# Patient Record
Sex: Female | Born: 1950 | Race: White | Hispanic: No | Marital: Married
Health system: Midwestern US, Community
[De-identification: ages and names within clinical notes are randomized; demographics above are authoritative.]

---

## 2012-01-19 IMAGING — MG MAMMOGRAPHY DIAGNOSTIC LEFT 3D TOMOSYNTHESIS WITH CAD
1 series · 3 of 3 positions shown · non-contrast
Comparison: Fortunately we were provided with a copy of the left breast
mammograms from 04/24/11.

MAMMOGRAPHY DIAGNOSTIC LEFT WITH CAD, 01/19/12:
HISTORY: [place]  She had a
screening mammogram [place] on 04/24/11.  On that report it was
recommended that she have ultrasound of the inferior left breast because of
a focal mammographic abnormality at 6 o'clock.
BREAST DENSITY: (Level 3 of 4) The breast tissue is heterogeneously dense.
This may lower the sensitivity of mammography.

[L CC · left · 3 of 3 slices shown]
[im 1/3]
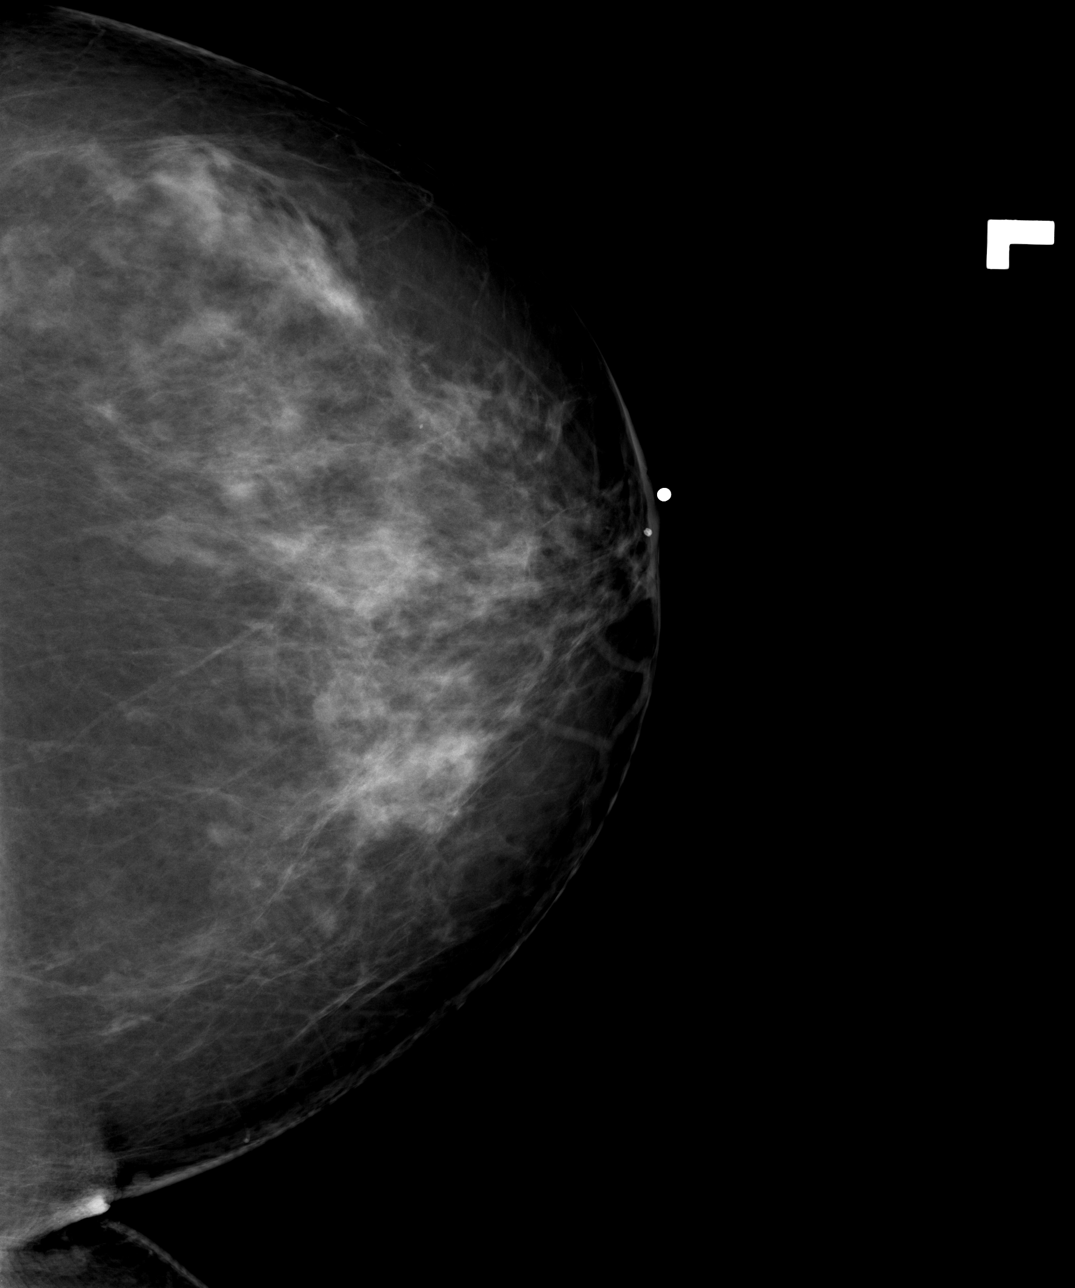
[im 2/3]
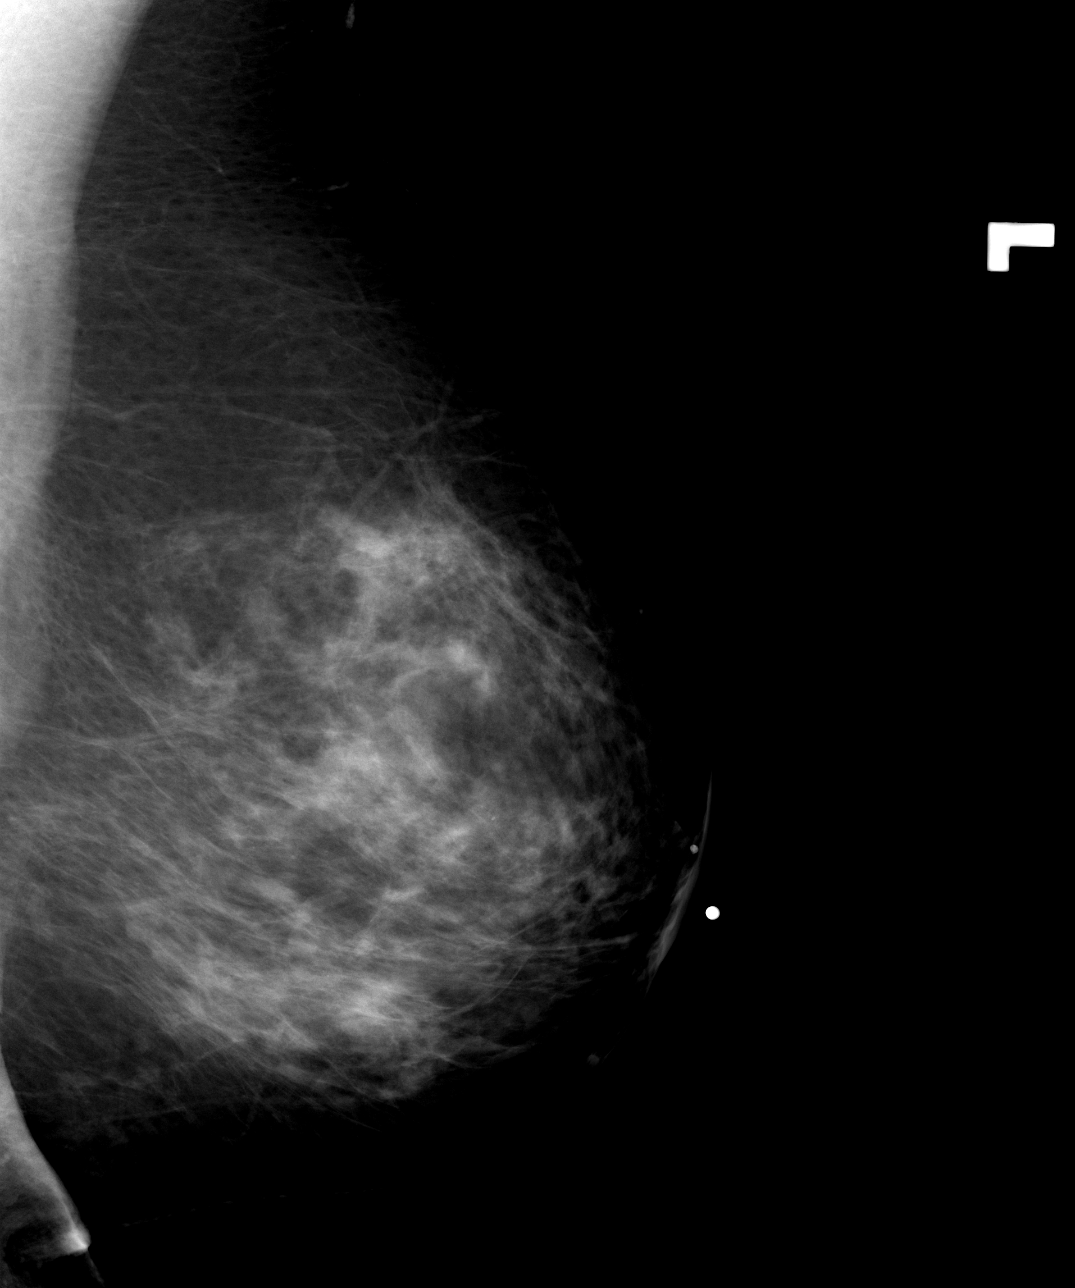
[im 3/3]
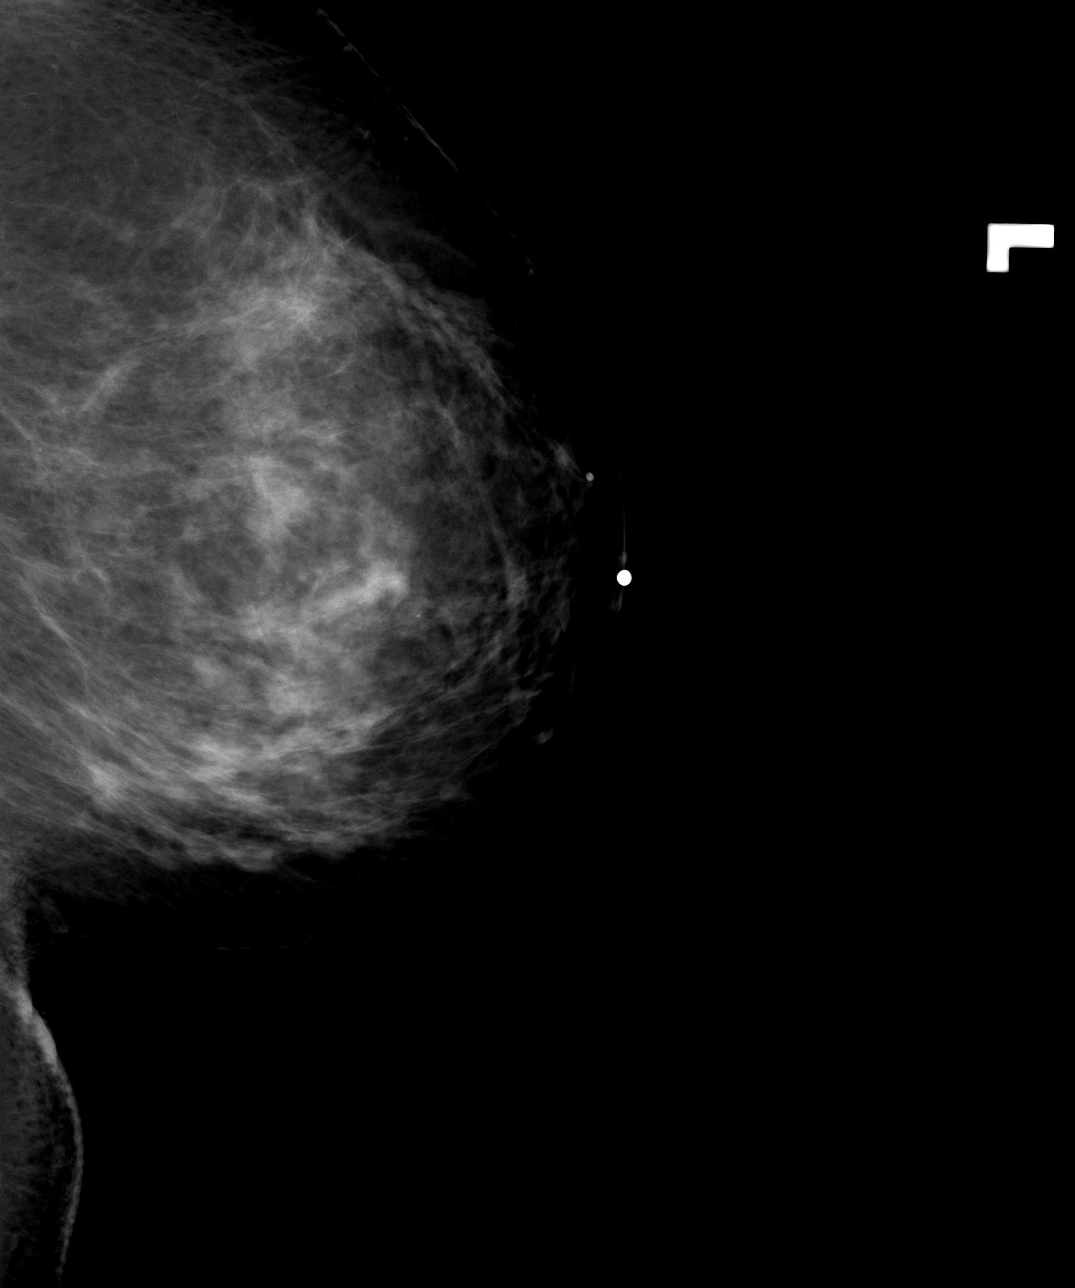

[3 of 3 positions shown; findings below may reference images not displayed]

FINDINGS: CC, MLO, and lateral mammograms are performed.  These show
numerous ill-defined nodular opacities which are not significantly changed
from the outside mammograms.
Sonography of the inferior left breast showed a potentially suspicious area
of ill-defined shadowing at 6 o'clock but this does not correlate with
anything on today's mammograms.
In summary, we have a potentially suspicious abnormality at 6 o'clock in
the left breast found by sonography only.  Under these circumstances I am
recommending that MR imaging of the breast be performed for further
evaluation.
IMPRESSION: (BI-RADS 0) Further evaluation with MRI is suggested.

## 2017-05-16 IMAGING — MG MAMMOGRAPHY SCREENING BILATERAL 3D TOMOSYNTHESIS WITH CAD
6 of 14 series · 6 of 40 positions shown · non-contrast
Comparison: 05/08/2016 through 04/23/2014. 
BREAST DENSITY: (Level B) There are scattered areas of fibroglandular density.

MAMMOGRAPHY SCREENING BILATERAL 3D TOMOSYNTHESIS WITH CAD, 05/16/2017 [DATE]: 
CLINICAL INDICATION: Screening.
TECHNIQUE: Digital bilateral mammograms and 3-D Tomosynthesis were obtained. 
These were interpreted both primarily and with the aid of computer-aided 
detection system.

[R CC synth-2D]
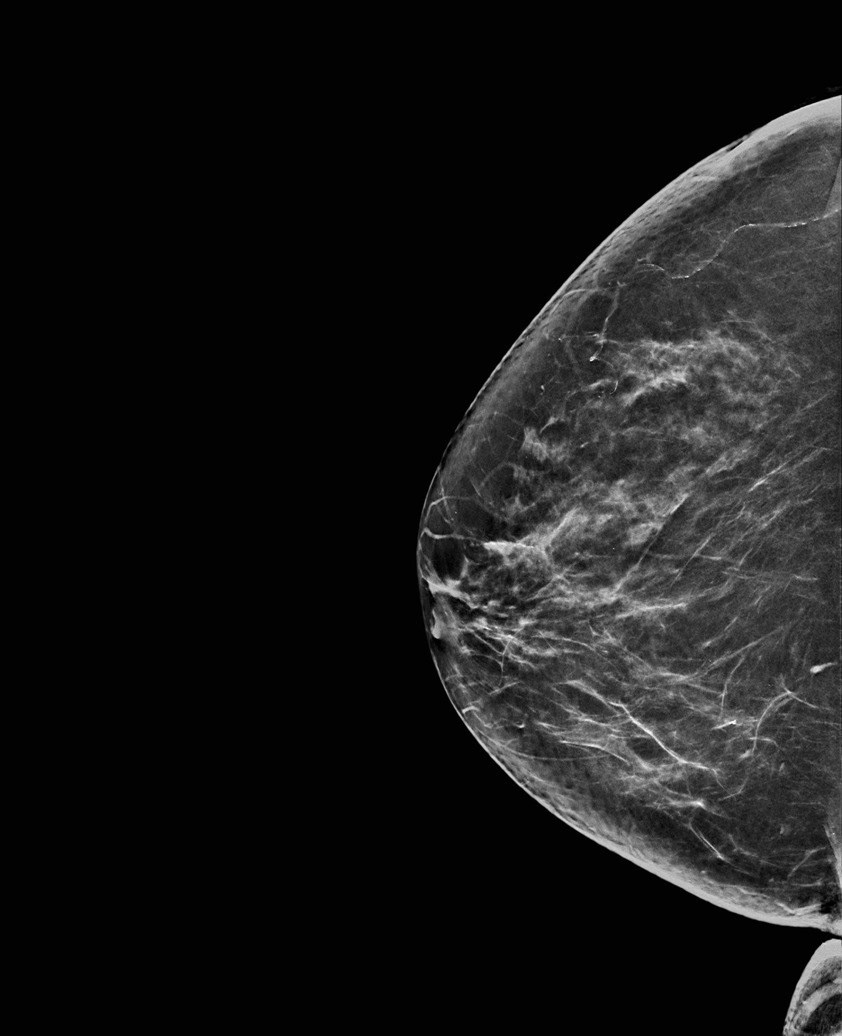

[L CC synth-2D]
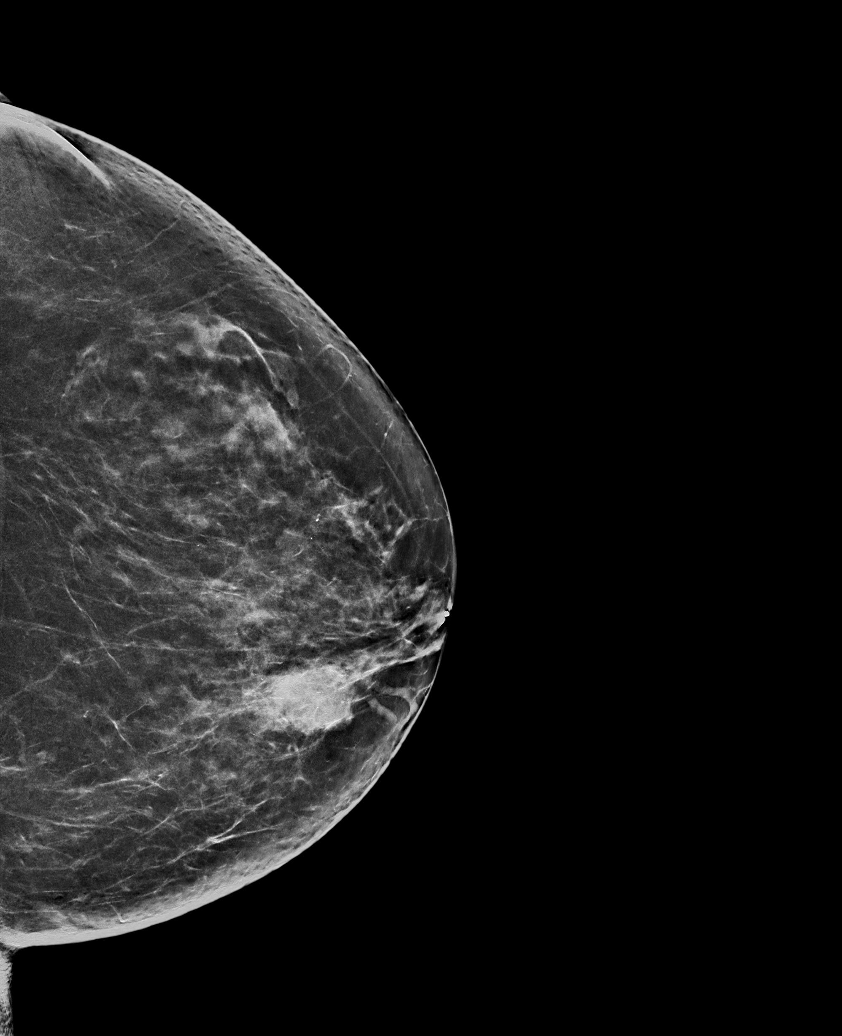

[L MLO synth-2D]
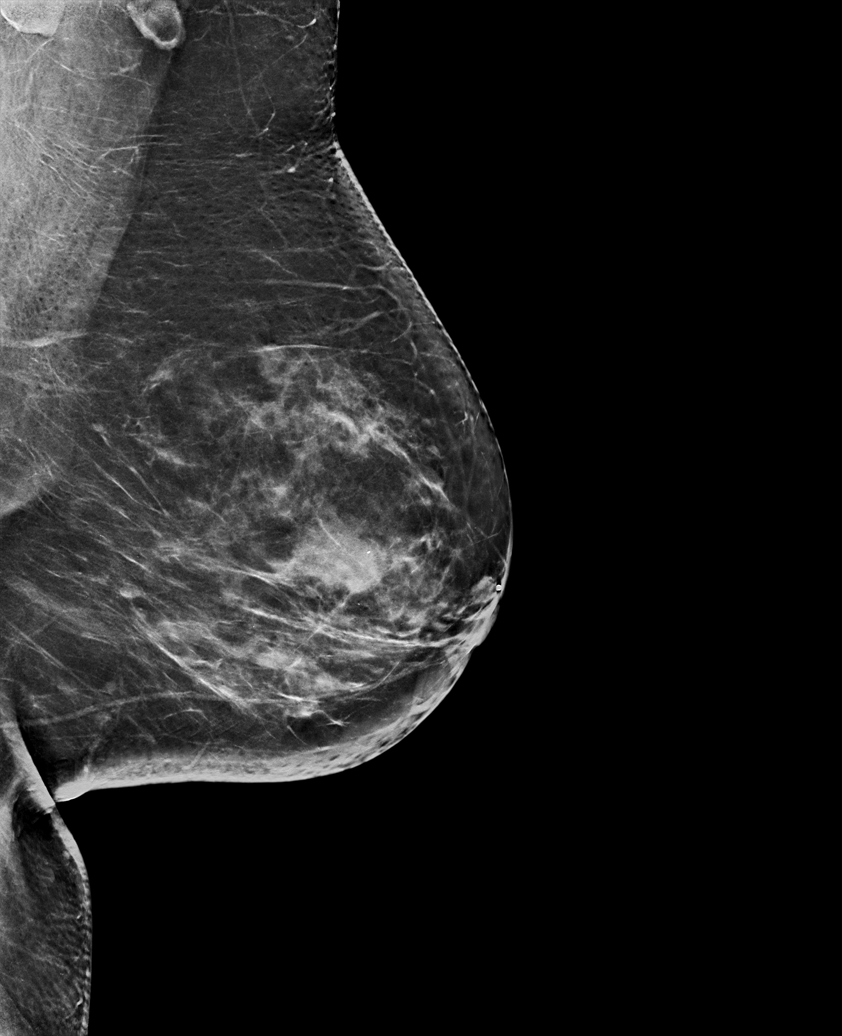

[R MLO synth-2D]
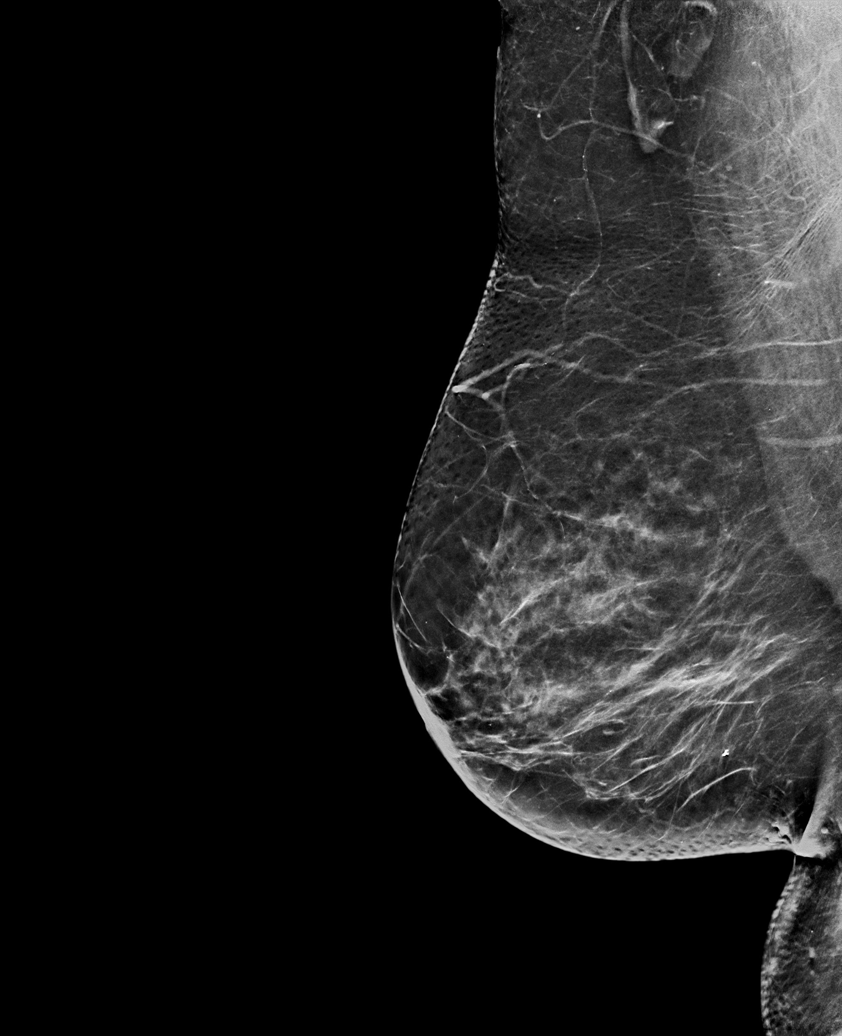

[L CC]
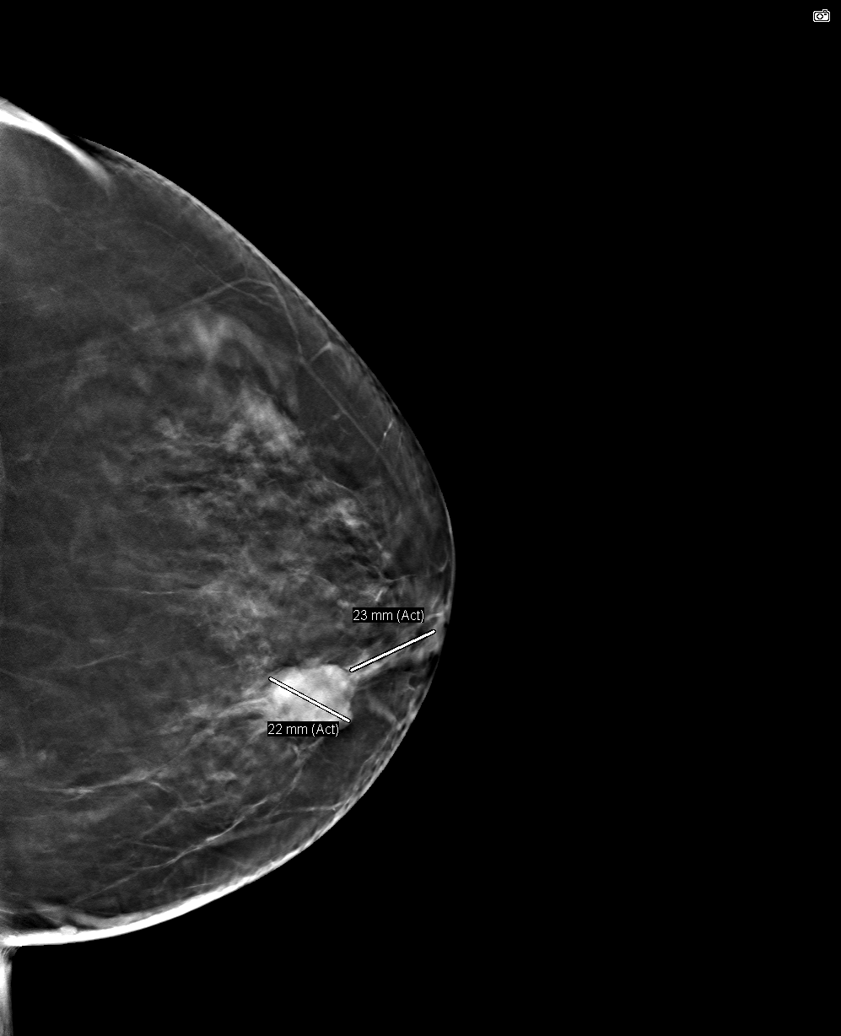

[L MLO]
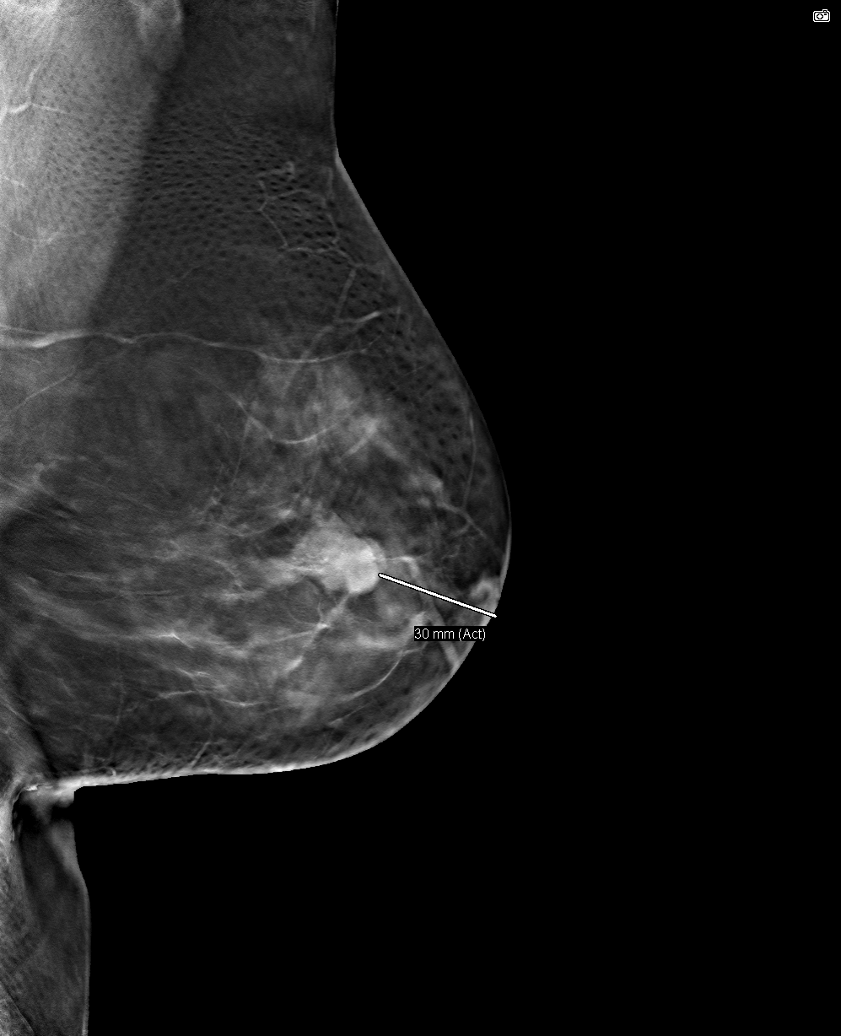

[6 of 40 positions shown; findings below may reference images not displayed]

FINDINGS: A 22 mm lobulated focal density has developed in the left breast 3 
cm 
deep to the nipple in the medial aspect of the breast. This lesion will be 
further evaluated with sonography. The breasts are otherwise unremarkable and 
unchanged.
IMPRESSION: (BI-RADS 0) Incomplete. Further evaluation will be performed as discussed 
above, 
and the results will be reported separately.

## 2017-07-17 IMAGING — US ULTRASOUND GUIDED BREAST BX
1 series · 6 of 6 positions shown · non-contrast
Comparison: none

ULTRASOUND GUIDED BREAST BX, 07/17/2017 [DATE]: 
CLINICAL INDICATION: Irregularly marginated mass in the left breast at [DATE], 
4 
cm from the nipple.
TECHNIQUE: Following discussion of the procedure with the patient including 
the 
possibilities of bleeding, infection, local tissue damage, and failure to 
acquire a diagnostic biopsy and obtaining informed consent, the patient's left 
breast was prepped and draped in the usual sterile manner. Local anesthesia 
was 
administered along the superior medial margin of the left areola at [DATE]. 
Axis 
to the area of abnormality was acquired with an 18-gauge BioPince needle. 6 
core 
biopsies were obtained and were sent for pathologic assessment. The patient 
tolerated the procedure well. No complications were experienced.

[Series 2: vascular · 6 of 6 slices shown]
[im 1/6]
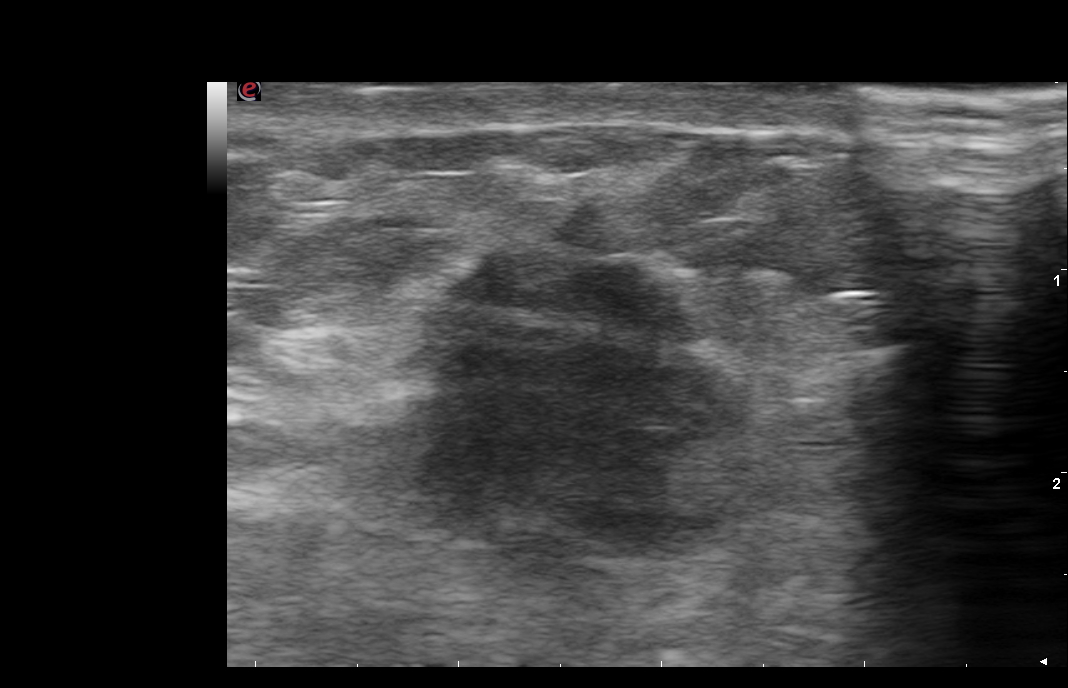
[im 2/6]
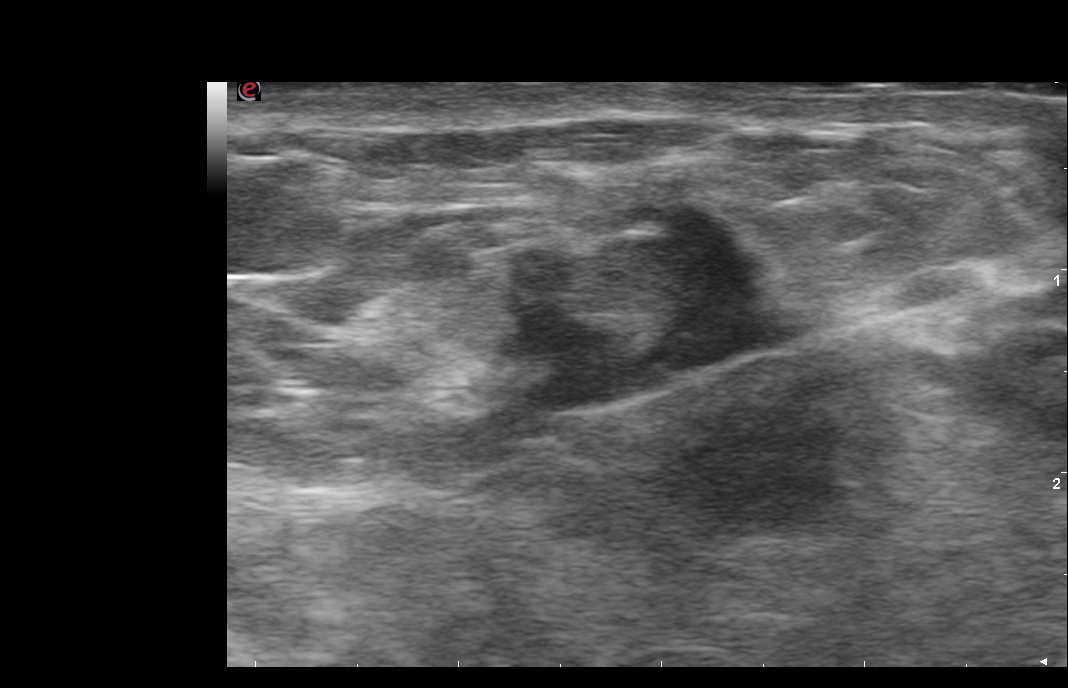
[im 3/6]
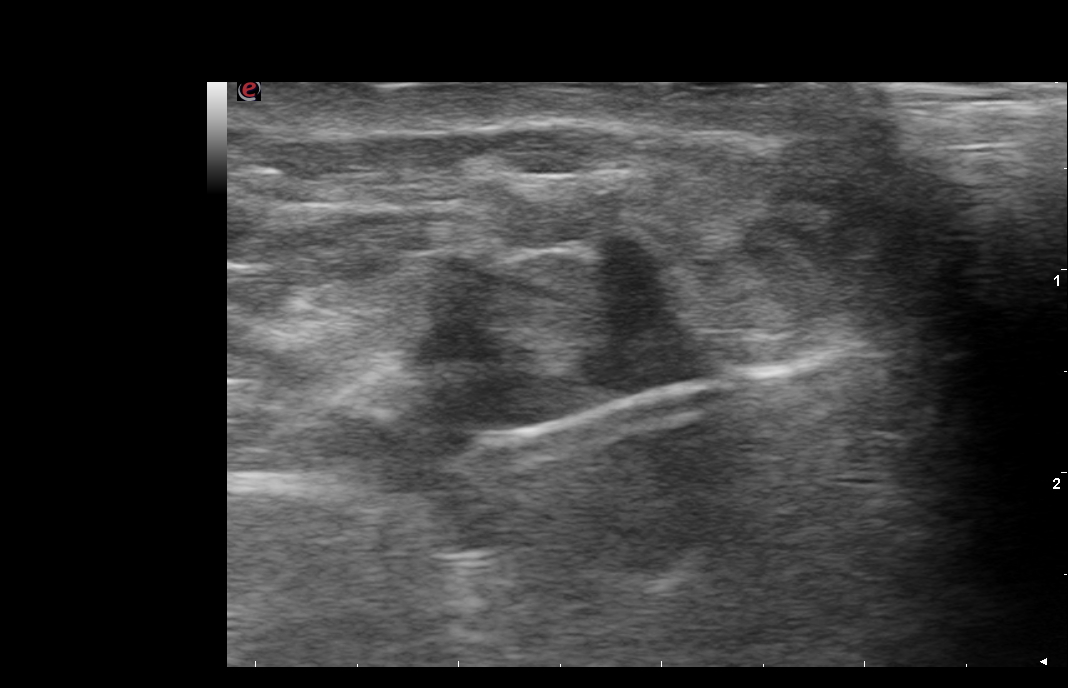
[im 4/6]
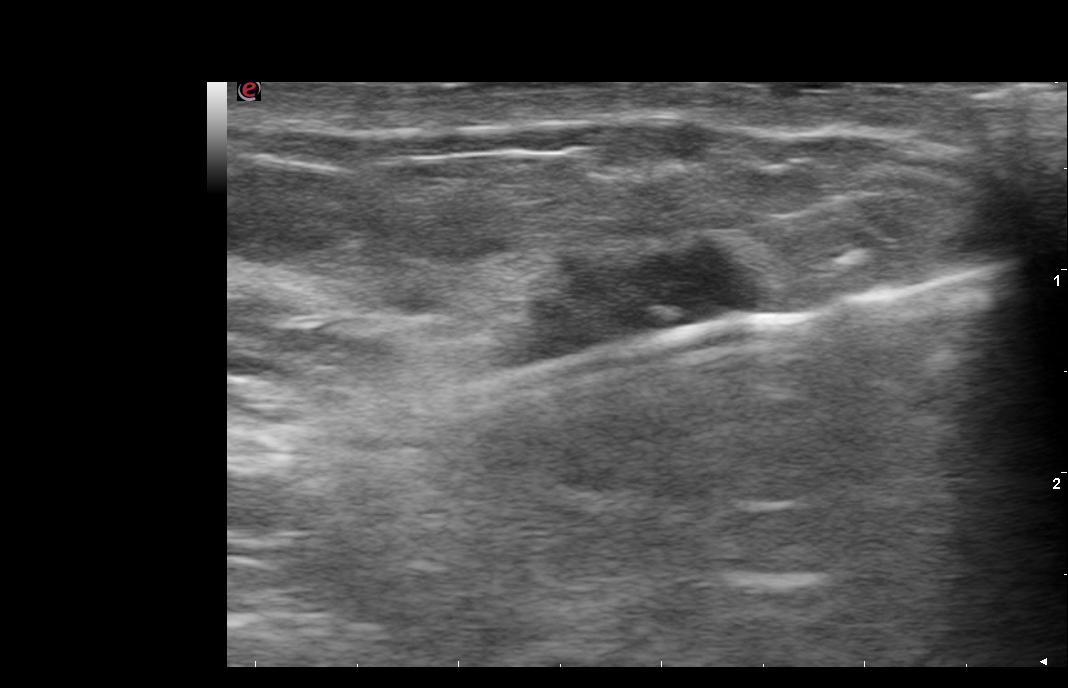
[im 5/6]
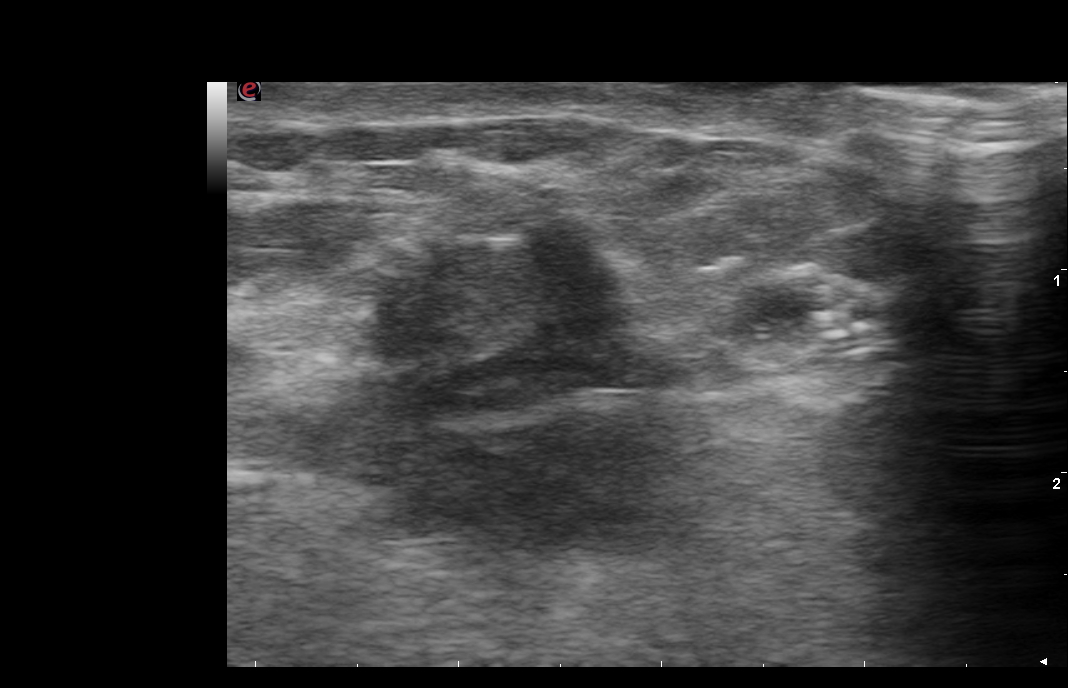
[im 6/6]
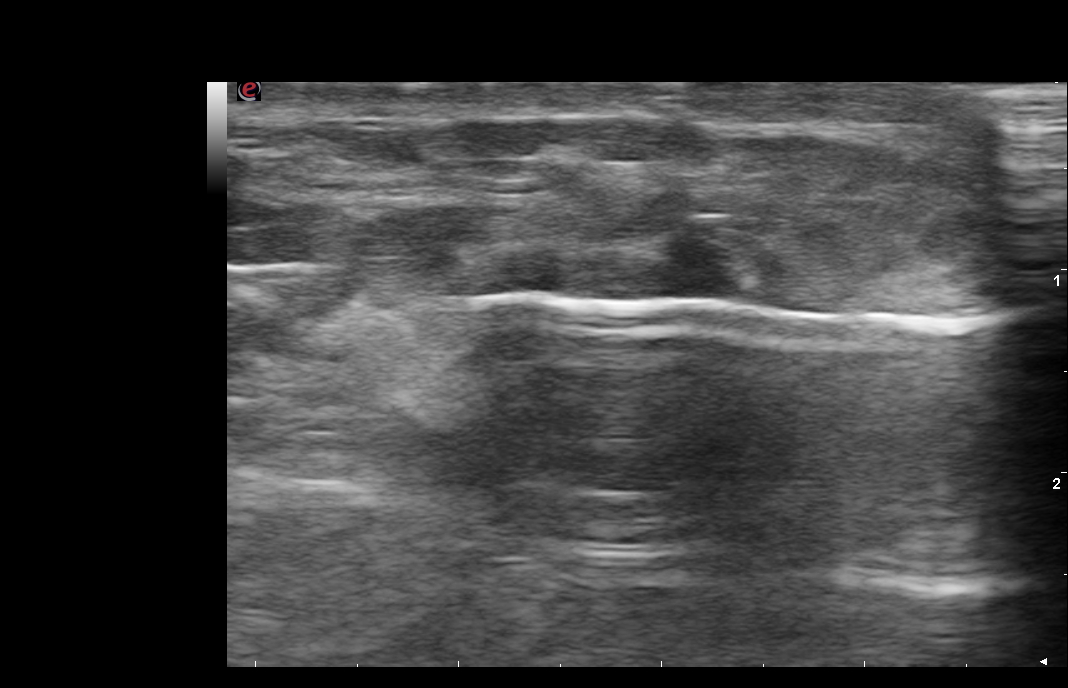

[6 of 6 positions shown; findings below may reference images not displayed]

IMPRESSION: Left breast mass biopsy performed under ultrasound guidance 
without 
complications as described above.

## 2019-09-26 IMAGING — MR MRI BRAIN W/WO CONTRAST
11 series · 43 of 48 positions shown · IV contrast (gadavist)
Comparison: Enhanced cranial MRI June 09, 2019, cranial CT June 12, 2019

MRI BRAIN W/WO CONTRAST,09/26/2019 [DATE]: 
CLINICAL INDICATION: Status post meningioma resection June 11, 2019
TECHNIQUE: Axial T1, Axial T2, Axial FLAIR, Diffusion weighted images, Sagittal 
T1, Enhanced Axial T1, and Enhanced coronal fat-suppressed T1 were obtained. 8 
cc's of gadavist was injected intravenously by hand. The patient's eGFR was 
calculated to be 82.7 using the i-STAT device. The patient's eGFR was calculated 
to be 82.7 using the i-STAT device.

[Series 103: patient aligned mpr · axial · 15.6mm · 0.98mm/px · z∈[-73,+153]mm · 6 of 51 slices shown]
[im 1/51]
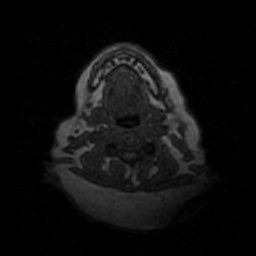
[im 11/51]
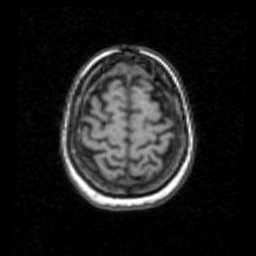
[im 21/51]
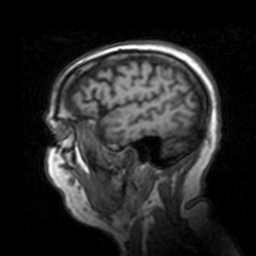
[im 31/51]
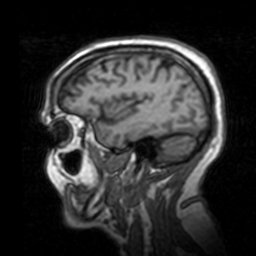
[im 41/51]
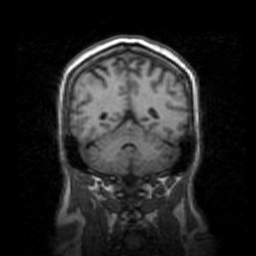
[im 51/51]
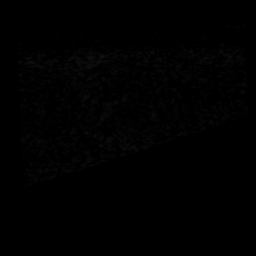

[Series 201: T1 · sagittal · 4.0mm · 0.86mm/px · 2 of 25 slices shown (1 of 2)]
[im 1/25]
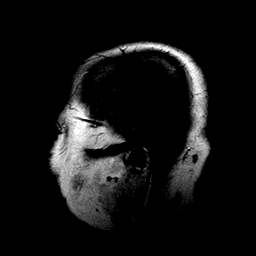
[im 25/25]
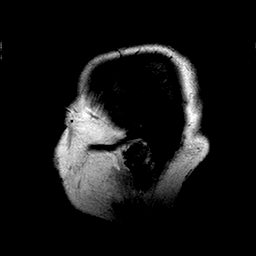

[Series 301: T1 · axial · 5.0mm · 0.60mm/px · z∈[-47,+108]mm · 4 of 27 slices shown (2 of 2)]
[im 1/27]
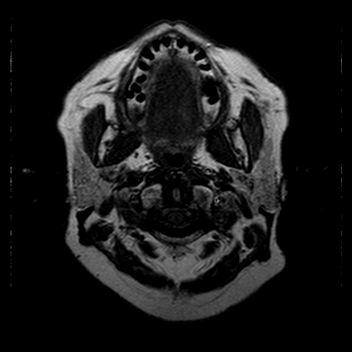
[im 9/27]
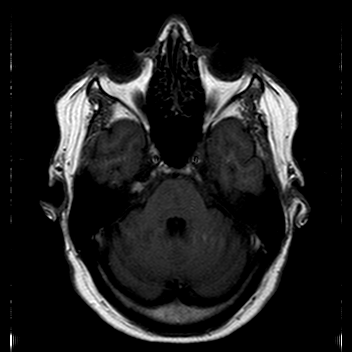
[im 18/27]
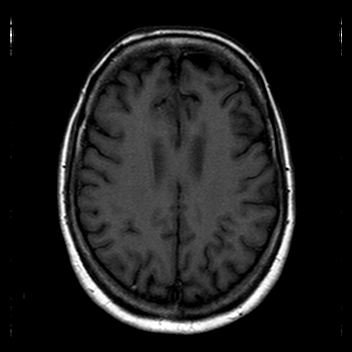
[im 27/27]
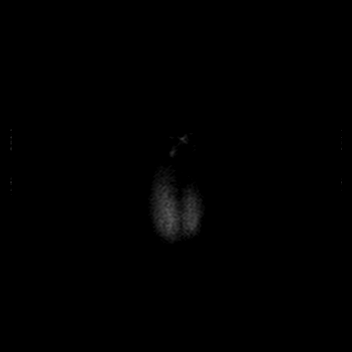

[Series 401: T2 · axial · 5.0mm · 0.41mm/px · z∈[-47,+108]mm · 4 of 27 slices shown]
[im 1/27]
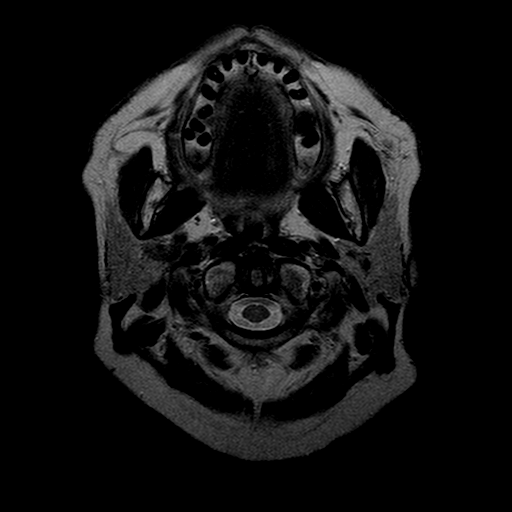
[im 9/27]
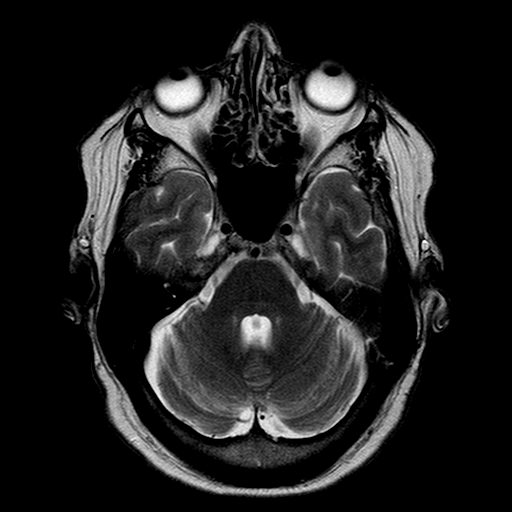
[im 18/27]
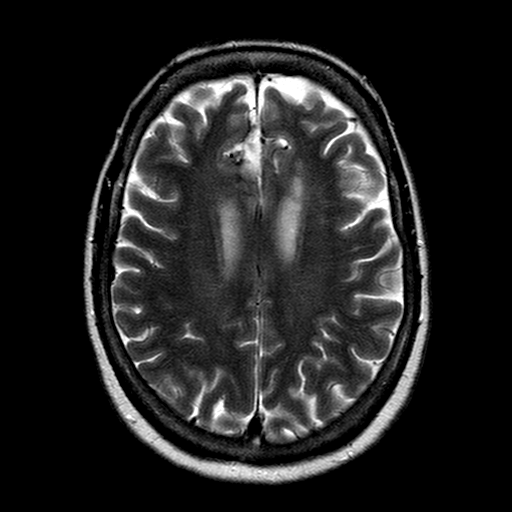
[im 27/27]
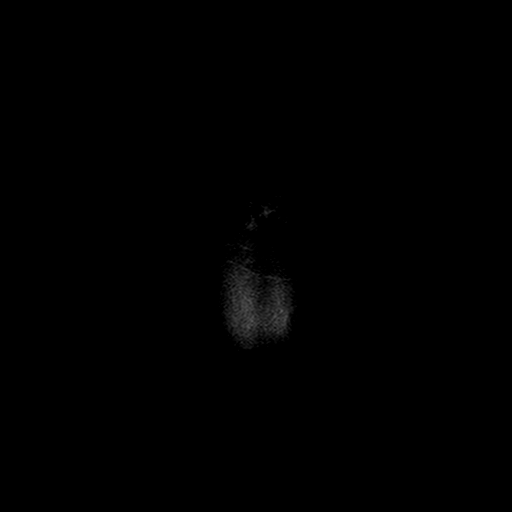

[Series 501: FLAIR · axial · 5.0mm · 0.73mm/px · z∈[-47,+108]mm · 4 of 27 slices shown]
[im 1/27]
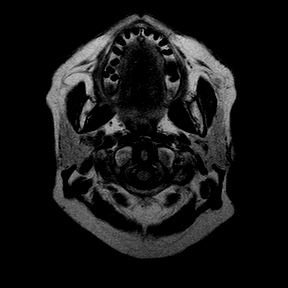
[im 9/27]
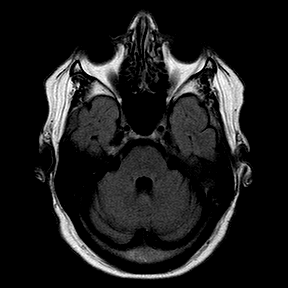
[im 18/27]
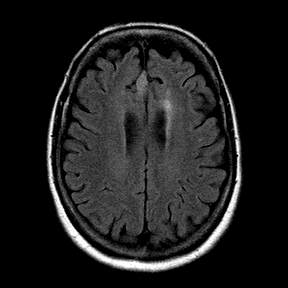
[im 27/27]
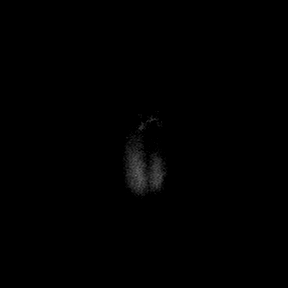

[Series 603: (id) · axial · 5.0mm · 1.80mm/px · z∈[-47,+109]mm · 4 of 27 slices shown]
[im 1/27]
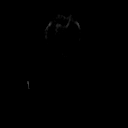
[im 9/27]
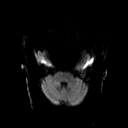
[im 18/27]
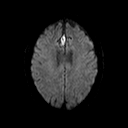
[im 27/27]
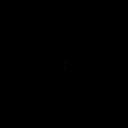

[Series 604: dadc map · axial · 5.0mm · 1.80mm/px · z∈[-47,+109]mm · 4 of 27 slices shown]
[im 1/27]
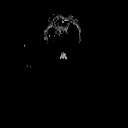
[im 9/27]
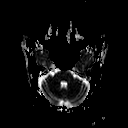
[im 18/27]
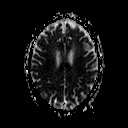
[im 27/27]
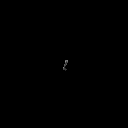

[Series 701: ven_bold · axial · 5.0mm · 0.41mm/px · z∈[-43,-3]mm · 3 of 60 slices shown]
[im 1/60]
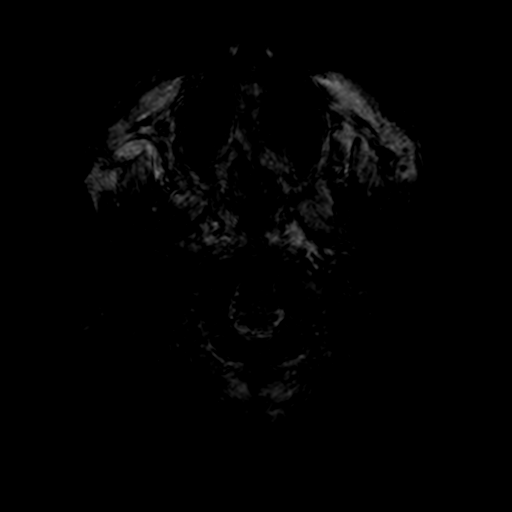
[im 9/60]
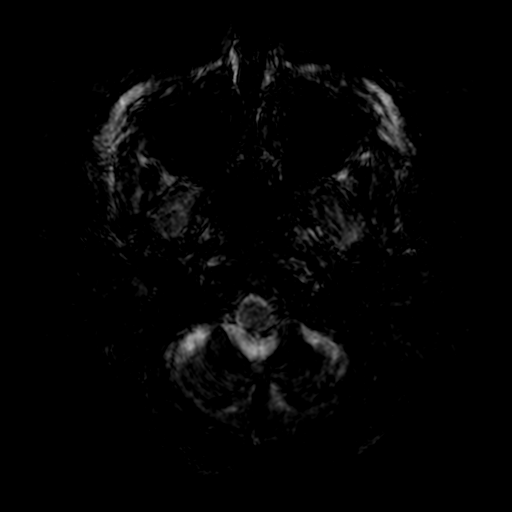
[im 17/60]
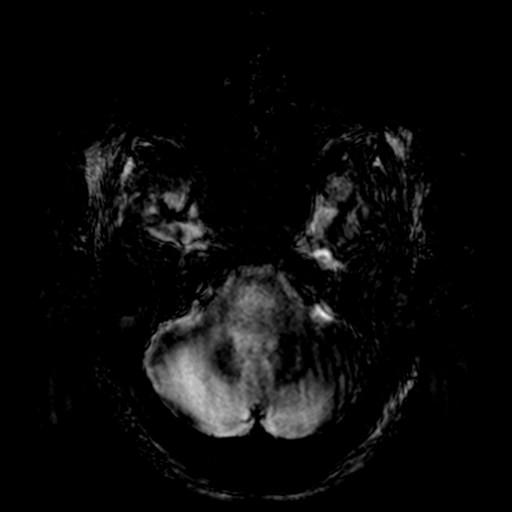

[Series 801: T1 post-contrast · axial · 5.0mm · 0.60mm/px · z∈[-47,+108]mm · 4 of 27 slices shown (1 of 3)]
[im 1/27]
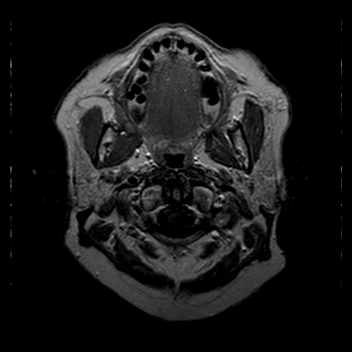
[im 9/27]
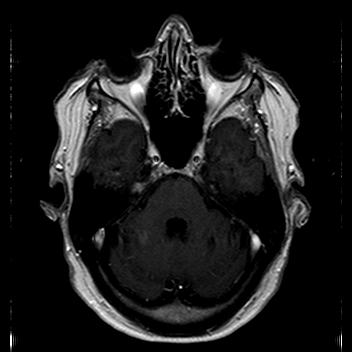
[im 18/27]
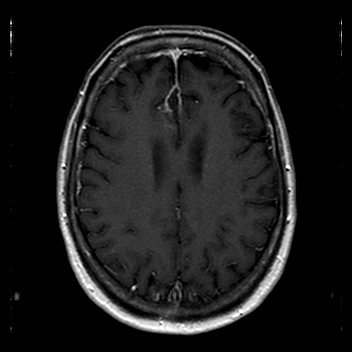
[im 27/27]
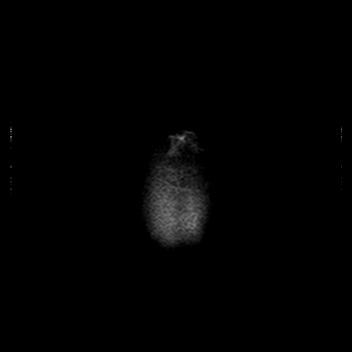

[Series 901: T1 post-contrast · coronal · 4.0mm · 0.88mm/px · 5 of 36 slices shown (2 of 3)]
[im 1/36]
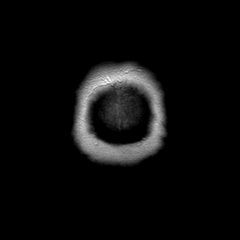
[im 9/36]
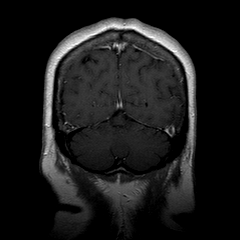
[im 18/36]
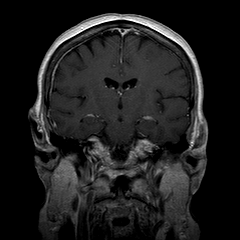
[im 27/36]
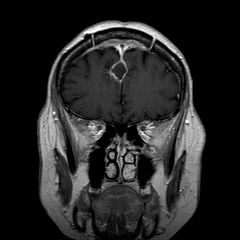
[im 36/36]
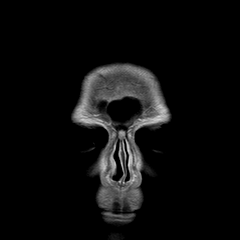

[Series 1001: T1 post-contrast · sagittal · 4.0mm · 0.86mm/px · 3 of 25 slices shown (3 of 3)]
[im 1/25]
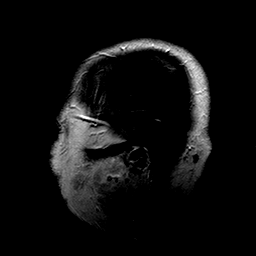
[im 13/25]
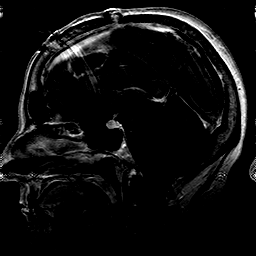
[im 25/25]
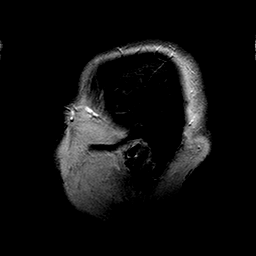

[43 of 48 positions shown; findings below may reference images not displayed]

FINDINGS: The large anterior parafalcine meningioma has been resected. There is 
a small fluid collection at the resection cavity extending 2.6 cm AP dimension, 
1.5 cm coronal plane. There is mild increased T2 signal in the adjacent frontal 
lobes, left greater than right, compatible with gliosis. There is mild 
peripheral enhancement. There is no diffusion restriction to indicate recent 
infarct. No other pathologic enhancement or mass effect identified. 
FLAIR sequences show mild increased signal along the ventricular margins. There 
is no hydrocephalus. No discrete brainstem or cerebellar findings. Major 
intracranial arterial segments are open. Paranasal sinuses and otomastoid spaces 
are clear. There is no orbital mass. Craniocervical junction is open. Sellar 
contents show vertical height of the adenohypophysis as great as 7 mm on 
enhanced sagittal image 13, not different from the prior study. Major dural 
sinuses are open. Paranasal sinuses and otomastoid spaces appear clear.
IMPRESSION: Status post resection of the large anterior parafalcine meningioma. There is a 
small resection cavity, without evidence for residual nodular enhancement or 
mass effect. There is mild adjacent gliosis, left greater than right. No new 
intracranial mass. 
No evidence for recent infarct. 
Mild prominence of the adenohypophysis for age, similar to the prior MRI. 
Endocrinologic correlation advised. This could represent pituitary hyperplasia, 
but large macroadenoma cannot be excluded. If clinically indicated dedicated 
dynamic enhanced pituitary MRI would be useful.

## 2020-05-21 IMAGING — CT CT ABDOMEN AND PELVIS W/WO CONTRAST
2 of 4 series · 14 of 46 positions shown, 16 images · IV contrast (APPLIED)
Comparison: There are no previous exams available for comparison.

CT ABDOMEN AND PELVIS W/WO CONTRAST, 05/21/2020 [DATE]: 
CLINICAL INDICATION:  Epigastric pain, diarrhea 
A search for DICOM formatted images was conducted for prior CT imaging studies 
completed at a non-affiliated media free facility.
TECHNIQUE: The abdomen and pelvis were scanned from lung bases through the 
pubic rami without and with 122cc's of Isovue 300 contrast injected 
intravenously on a high-resolution CT scanner using dose reduction techniques. 
Routine MPR reconstructions were performed.

[Series 5: abd/pel ax w · axial · 0.84mm/px · z∈[-458,-30]mm · 11 of 161 slices shown, 13 images]
[im 9/161  soft-tissue]
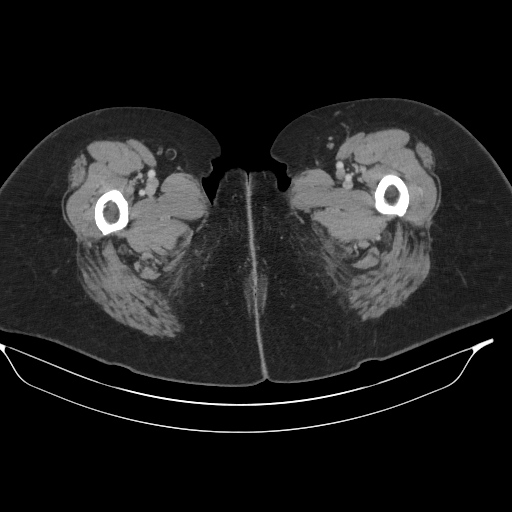
[im 9/161  bone]
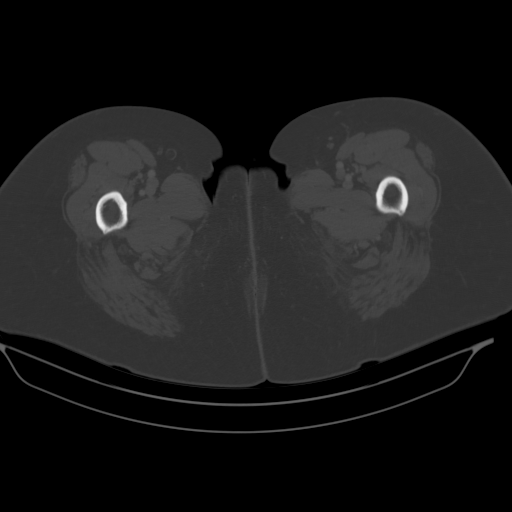
[im 26/161  soft-tissue]
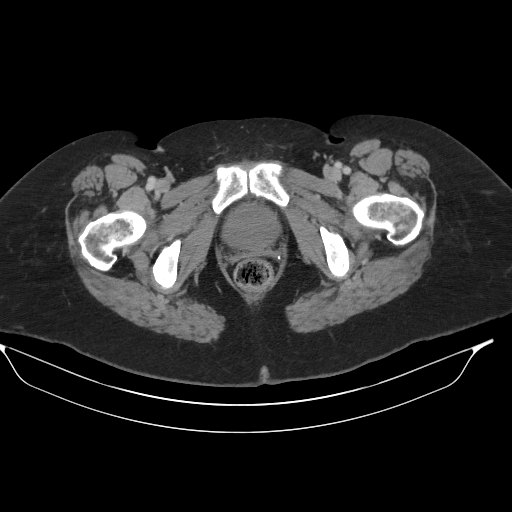
[im 43/161  soft-tissue]
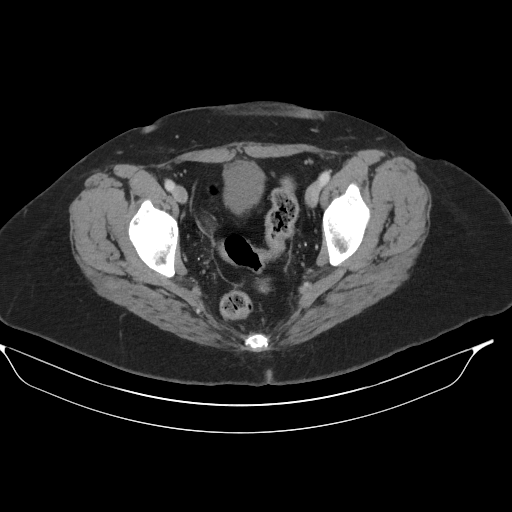
[im 51/161  soft-tissue]
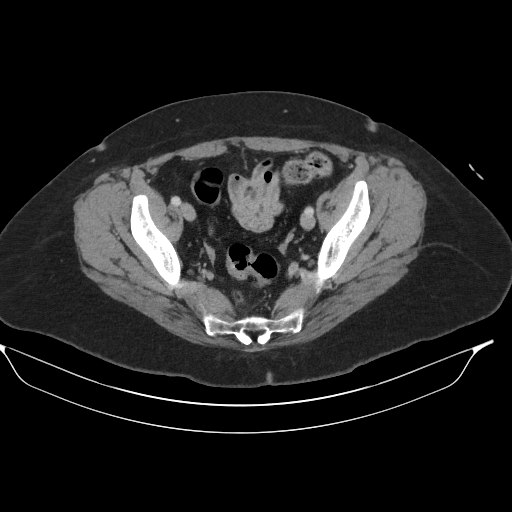
[im 68/161  soft-tissue]
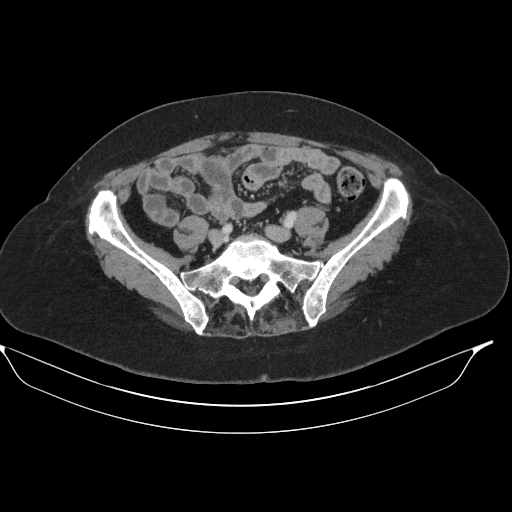
[im 85/161  soft-tissue]
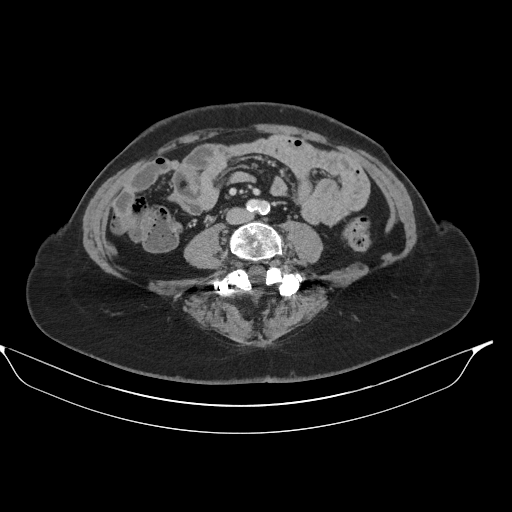
[im 93/161  soft-tissue]
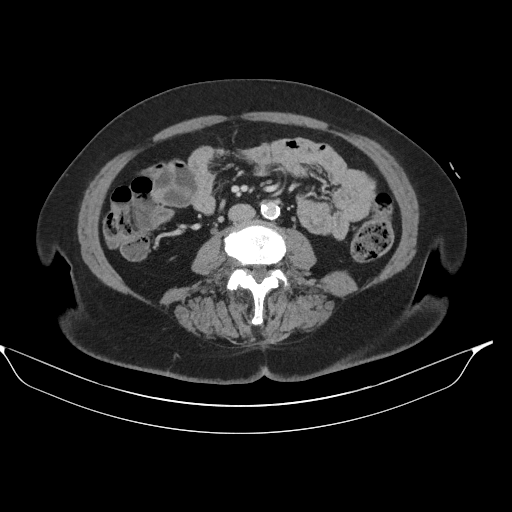
[im 110/161  soft-tissue]
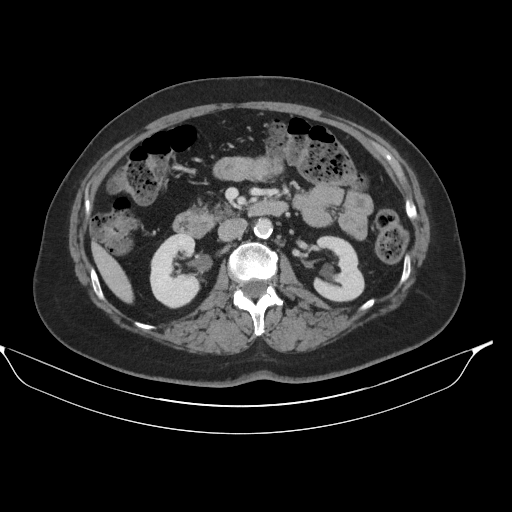
[im 118/161  soft-tissue]
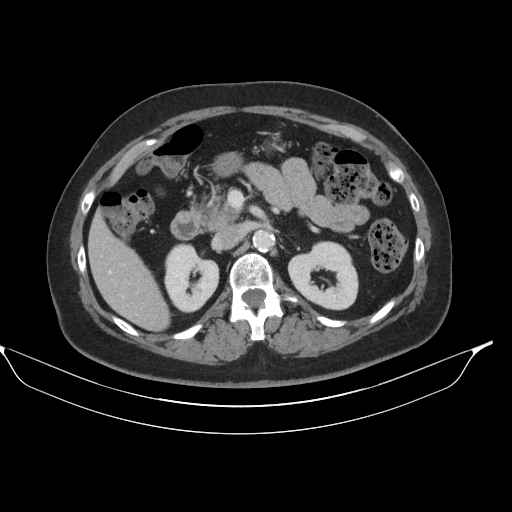
[im 118/161  bone]
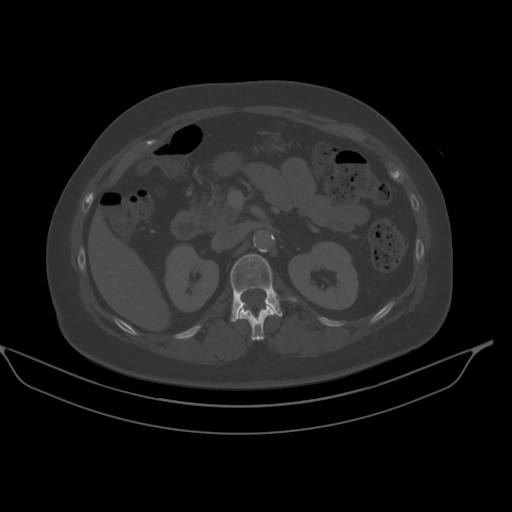
[im 135/161  soft-tissue]
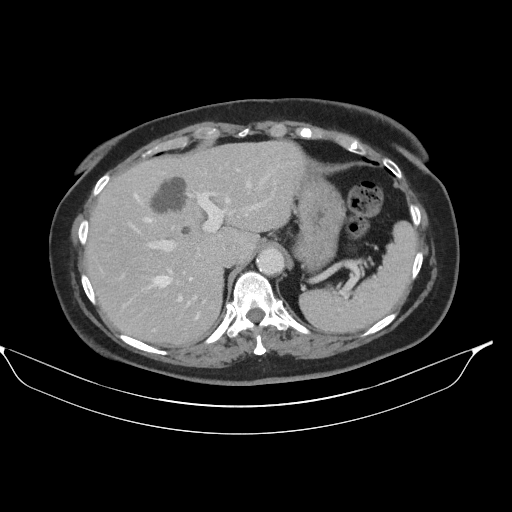
[im 152/161  soft-tissue]
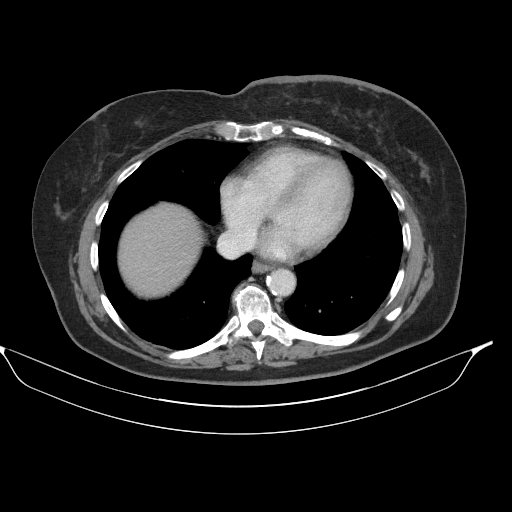

[Series 6: abd/pel cor w · coronal · 0.75mm/px · 3 of 161 slices shown]
[im 54/161  soft-tissue]
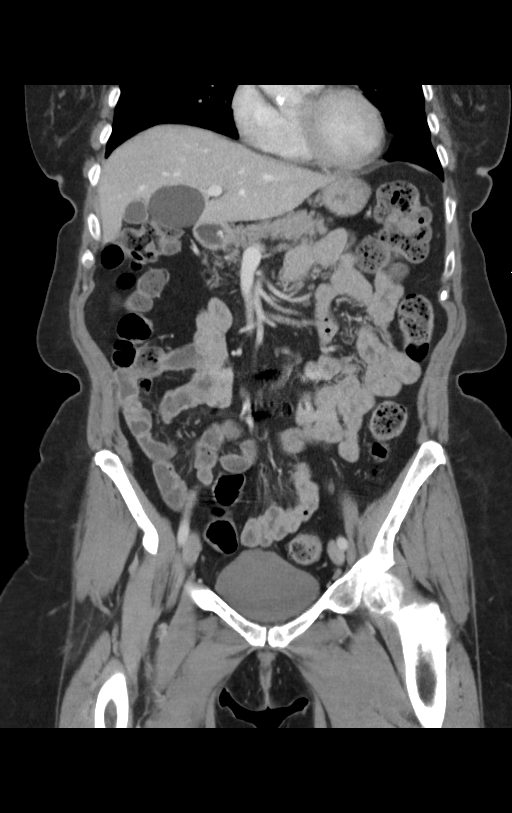
[im 72/161  soft-tissue]
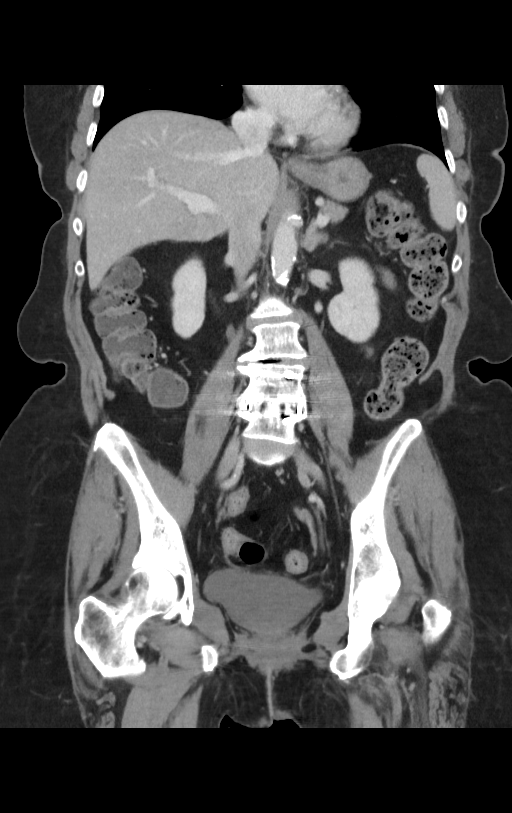
[im 89/161  soft-tissue]
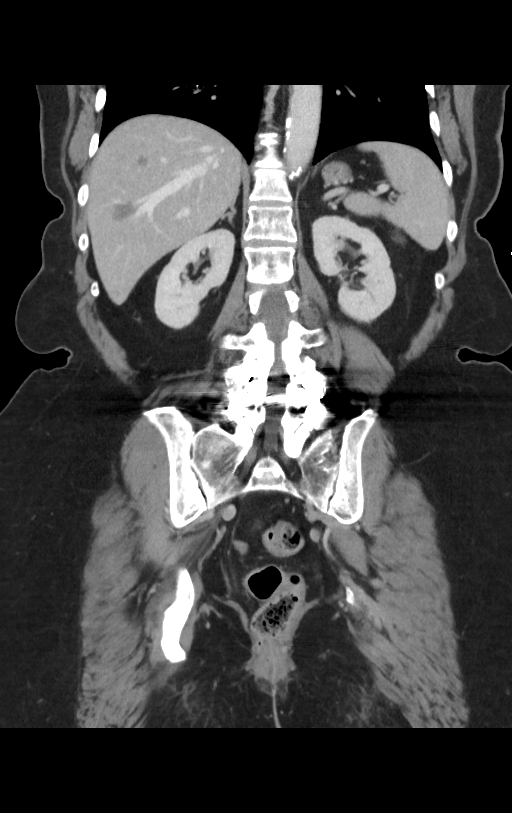

[14 of 46 positions shown; findings below may reference images not displayed]

FINDINGS: Lung bases are clear. The heart is not enlarged. There is a 
benign-appearing cyst in the right lobe of the liver, 2.3 cm. This is 
indeterminant and follow-up with enhanced CT or MRI recommended. There is a 
benign 4 cm cyst in the medial segment left hepatic lobe, smaller cyst in the 
lateral segment of the left lobe. Spleen is not enlarged. Gallbladder is 
nondistended. No gallstone identified. The common bile duct appears normal. No 
adrenal nodule. Pancreas is unremarkable. Kidneys appear normal without stone or 
hydronephrosis. Moderate aortic plaque with moderate stenosis seen on axial 
image 64, approximately 50%. 
Urinary bladder is nondistended. There is no retroperitoneal or intra-abdominal 
adenopathy. No ascites. No abdominal wall or inguinal hernia. 
No active bowel inflammation. No evidence for bowel obstruction. There is 
moderate colonic stool content. Stomach and small bowel loops appear 
unremarkable. 
There are degenerative changes. There has been lumbar fusion.
IMPRESSION: No evidence for visceral mass, adenopathy, ascites or active bowel inflammation. 
Moderate generalized colonic stool content. 
Benign-appearing hepatic cysts. 
No biliary pathology identified. A small amount of air in the distal esophagus 
is potentially associated with GERD. No hiatal hernia or gastric distention 
identified. 
Moderate aortic calcification plaque with approximately 50% stenosis. No 
aneurysm. No significant mesenteric artery occlusive disease identified. 
RADIATION DOSE REDUCTION: All CT scans are performed using radiation dose 
reduction techniques, when applicable.  Technical factors are evaluated and 
adjusted to ensure appropriate moderation of exposure.  Automated dose 
management technology is applied to adjust the radiation doses to minimize 
exposure while achieving diagnostic quality images.

## 2020-10-18 IMAGING — MR MRI BRAIN W/WO CONTRAST
10 series · 43 of 48 positions shown · IV contrast (gadavist)
Comparison: Brain MRI September 26, 2019

MRI BRAIN W/WO CONTRAST,10/18/2020 [DATE]: 
CLINICAL INDICATION: Follow-up meningioma, resection June 12, 2019
TECHNIQUE: Axial T1, Axial T2, Axial FLAIR, Diffusion weighted images, Sagittal 
T1, Enhanced Axial T1, and Enhanced coronal fat-suppressed T1 were obtained. 7 
cc's of Gadavist was injected intravenously by hand. The patient's eGFR was 
calculated to be 98 using the i-STAT device.

[Series 102: smartbrain · axial · 1.0mm · 0.98mm/px · 1 of 2 slices shown]
[im 1/2]
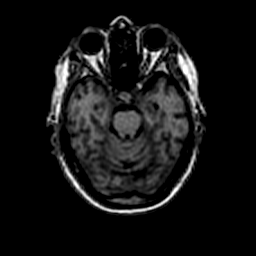

[Series 103: patient aligned mpr · axial · 15.6mm · 0.98mm/px · z∈[-77,+147]mm · 8 of 50 slices shown]
[im 1/50]
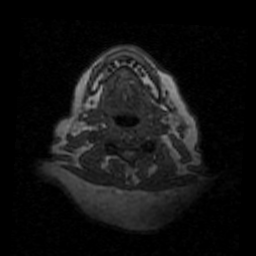
[im 8/50]
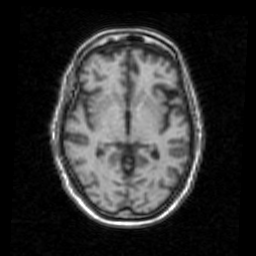
[im 15/50]
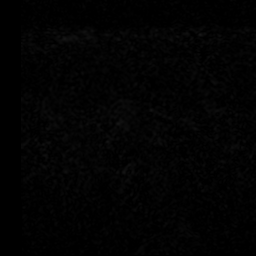
[im 22/50]
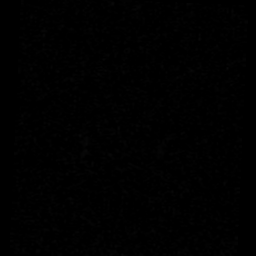
[im 29/50]
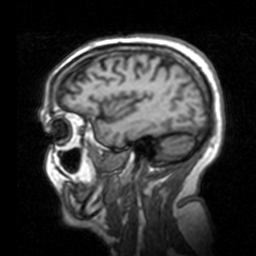
[im 36/50]
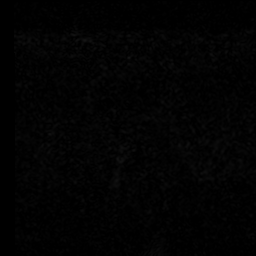
[im 43/50]
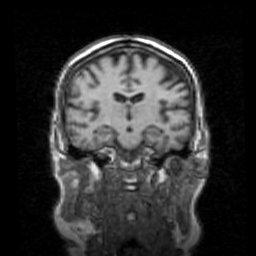
[im 50/50]
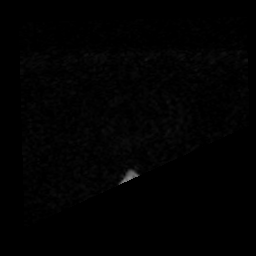

[Series 201: T1 · sagittal · 4.0mm · 0.86mm/px · 4 of 27 slices shown (1 of 2)]
[im 1/27]
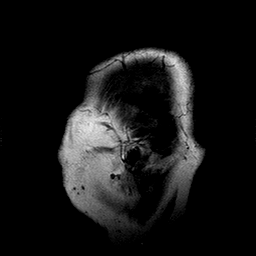
[im 9/27]
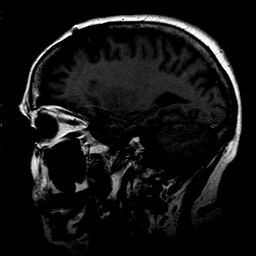
[im 18/27]
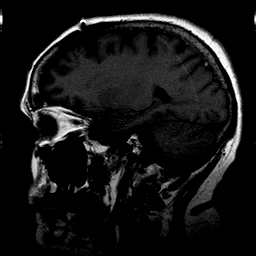
[im 27/27]
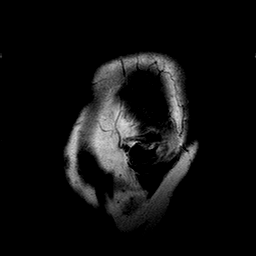

[Series 301: T1 · axial · 5.0mm · 0.60mm/px · z∈[-50,+106]mm · 4 of 27 slices shown (2 of 2)]
[im 1/27]
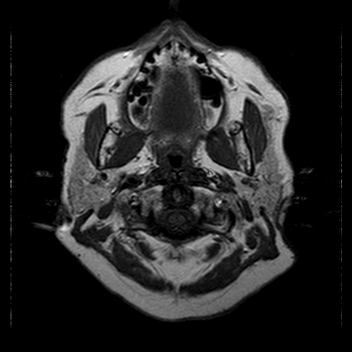
[im 9/27]
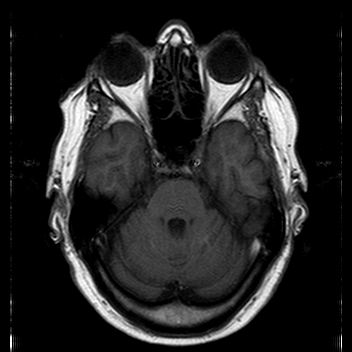
[im 18/27]
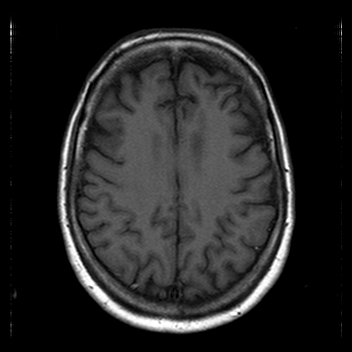
[im 27/27]
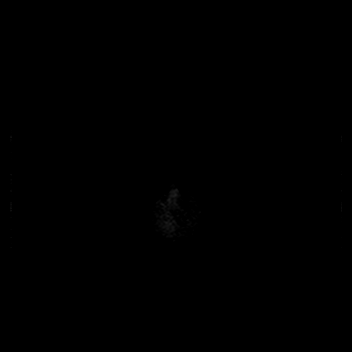

[Series 401: T2 · axial · 5.0mm · 0.41mm/px · z∈[-50,+106]mm · 4 of 27 slices shown]
[im 1/27]
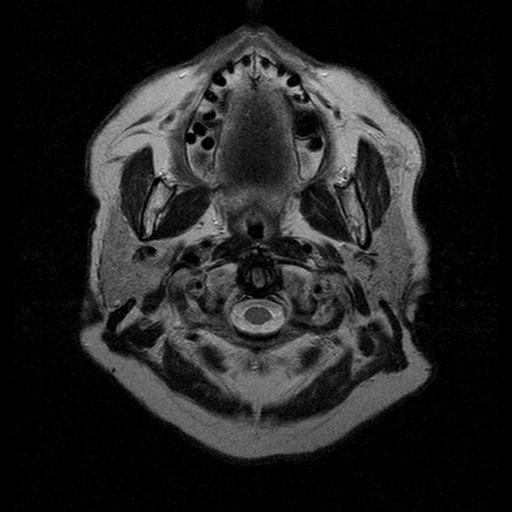
[im 9/27]
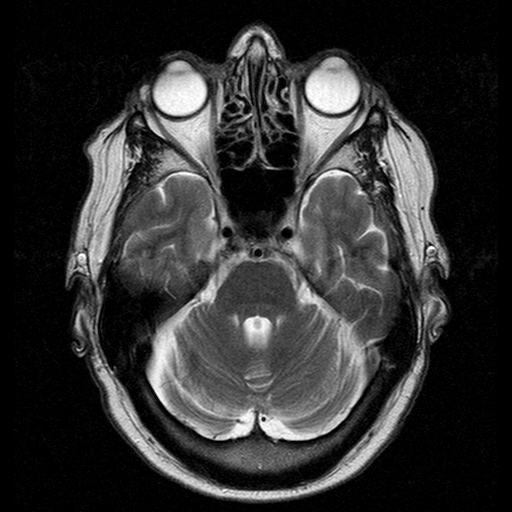
[im 18/27]
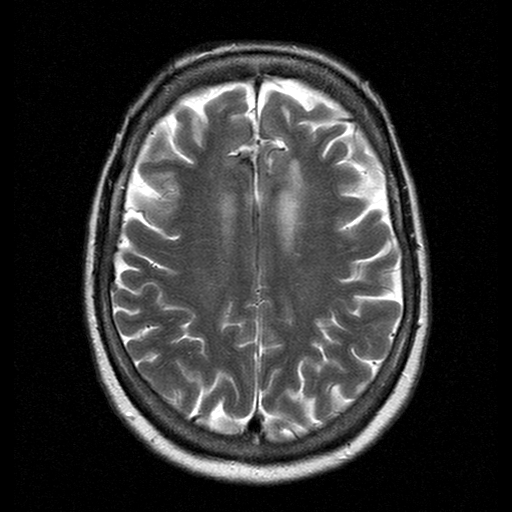
[im 27/27]
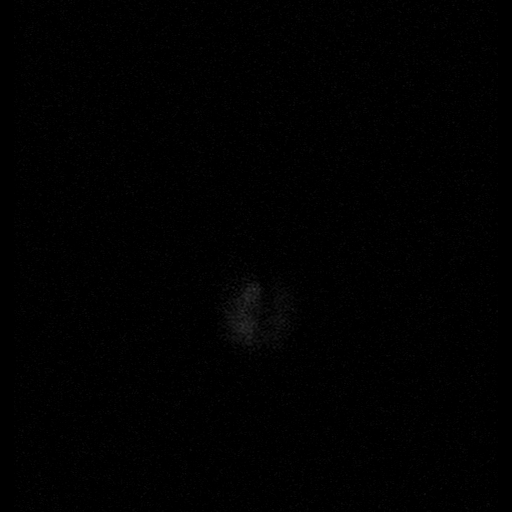

[Series 603: (id) · axial · 5.0mm · 1.80mm/px · z∈[-50,+100]mm · 4 of 26 slices shown]
[im 1/26]
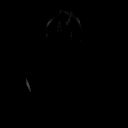
[im 9/26]
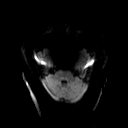
[im 17/26]
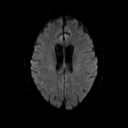
[im 26/26]
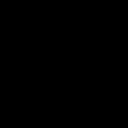

[Series 701: ven_bold · axial · 5.0mm · 0.41mm/px · z∈[-46,+9]mm · 4 of 60 slices shown]
[im 1/60]
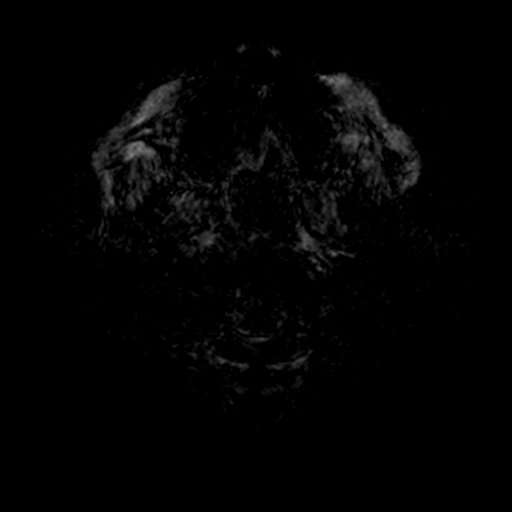
[im 8/60]
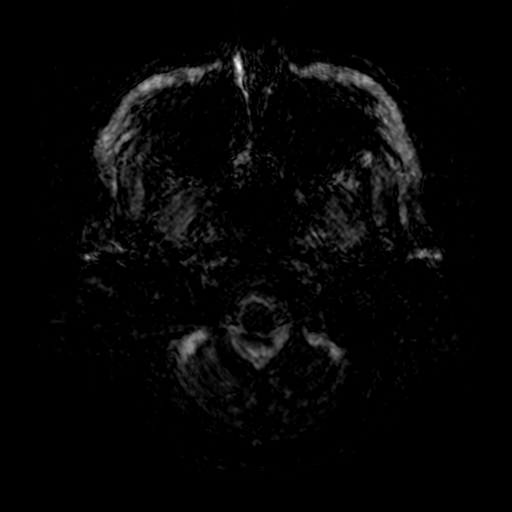
[im 15/60]
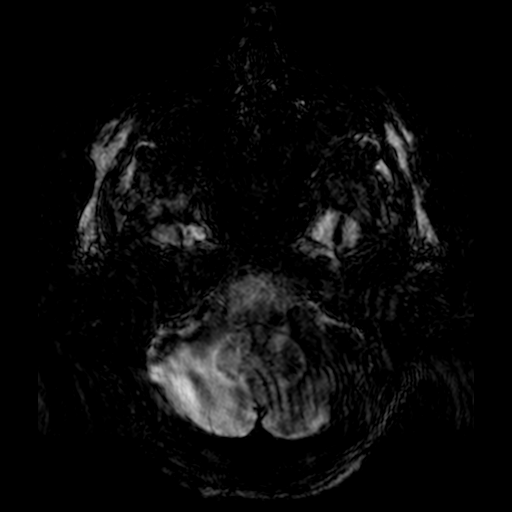
[im 23/60]
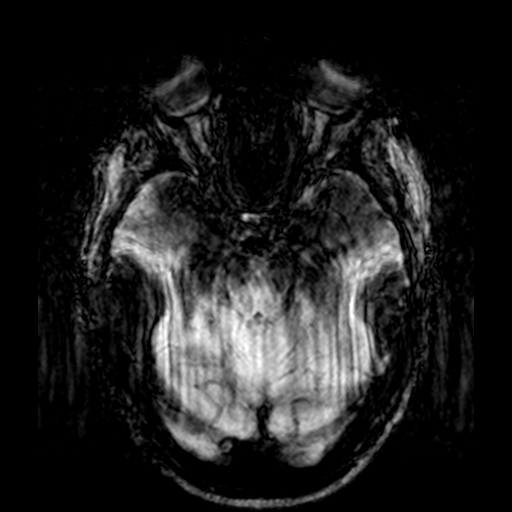

[Series 801: T1 post-contrast · axial · 5.0mm · 0.60mm/px · z∈[-50,+106]mm · 4 of 27 slices shown (1 of 3)]
[im 1/27]
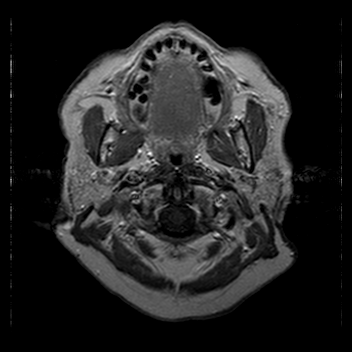
[im 9/27]
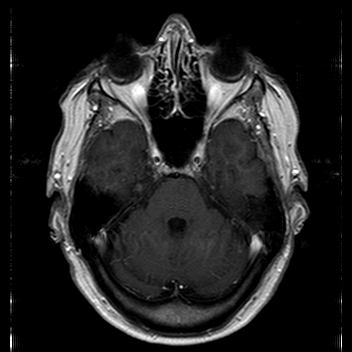
[im 18/27]
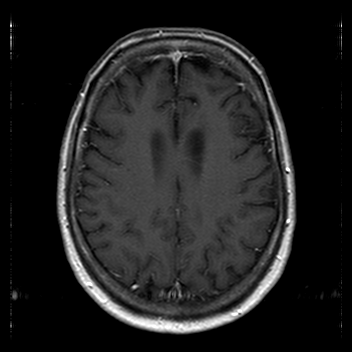
[im 27/27]
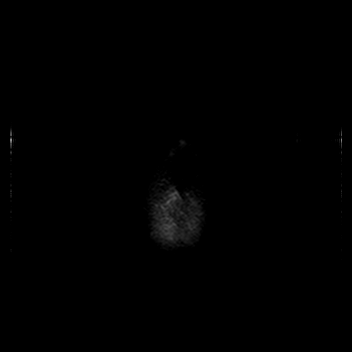

[Series 901: T1 post-contrast · coronal · 4.0mm · 0.88mm/px · 6 of 36 slices shown (2 of 3)]
[im 1/36]
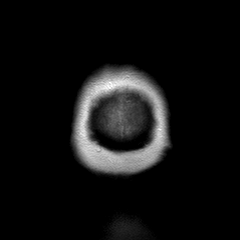
[im 8/36]
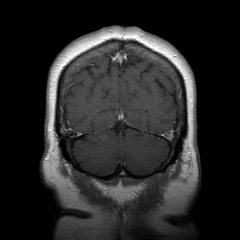
[im 15/36]
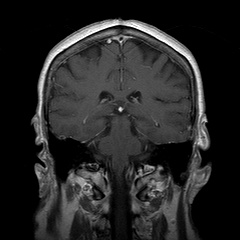
[im 22/36]
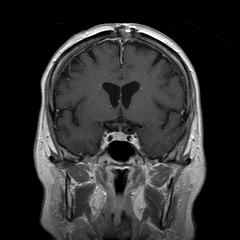
[im 29/36]
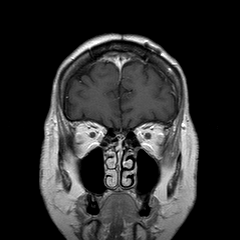
[im 36/36]
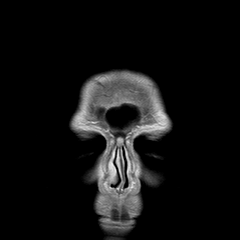

[Series 1001: T1 post-contrast · sagittal · 4.0mm · 0.86mm/px · 4 of 27 slices shown (3 of 3)]
[im 1/27]
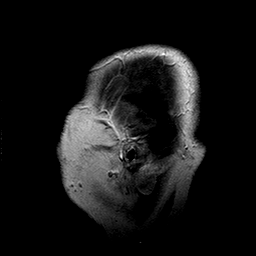
[im 9/27]
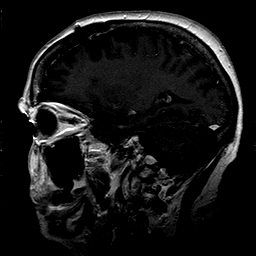
[im 18/27]
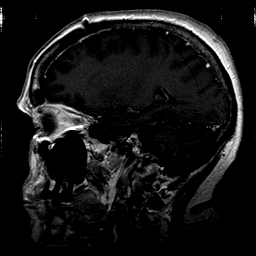
[im 27/27]
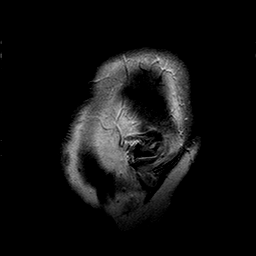

[43 of 48 positions shown; findings below may reference images not displayed]

FINDINGS: Patient is status post anterior vertex craniotomy. There is no 
evidence for recurrent falcine meningioma. Previously seen resection cavity has 
decreased. There is mild gliosis in the paramedian frontal lobes bilaterally 
best seen on FLAIR image 20. There is mild meningeal fibrosis along the 
interhemispheric fissure. No new intracranial mass. No evidence for infarct or 
other diffusion restriction. 
There is no hydrocephalus. No significant microangiopathic changes. No discrete 
brainstem or cerebellar lesion. Major arterial segments and dural sinuses are 
open. 
There is no orbital mass. Paranasal sinuses are predominantly clear. Small left 
maxillary retention cyst. Tympanic cavities and mastoid air cells are clear. 
There is no sellar or cavernous sinus lesion. Craniocervical junction is open.
IMPRESSION: No evidence for recurrent parafalcine meningioma or other intracranial neoplasm. 
Stable postsurgical changes.

## 2021-10-24 IMAGING — MR MRI BRAIN W/WO CONTRAST
6 of 13 series · 20 of 48 positions shown · IV contrast (Gadolinium)
Comparison: Brain MRI October 18, 2020

________________________________________________________________________________________________ 
MRI BRAIN W/WO CONTRAST,10/24/2021 [DATE]: 
CLINICAL INDICATION: Follow-up benign neoplasm cerebral meninges
TECHNIQUE: Multiplanar, multiecho position MR images of the brain were performed 
without and with 7.5 mL of Gadavist were injected intravenously. 0 mL of 
Gadavist was discarded.  Patient was scanned on a 1.5T magnet.

[Series 102: mpr - smartbrain · axial · 1.1mm · 1.09mm/px · 1 of 2 slices shown]
[im 1/2]
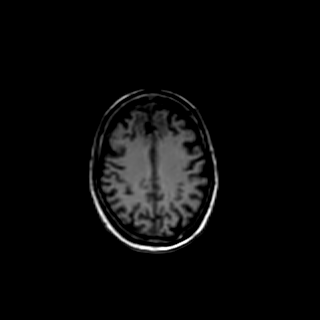

[Series 103: patient aligned mpr · axial · 21.9mm · 1.09mm/px · z∈[-218,+173]mm · 3 of 50 slices shown]
[im 1/50]
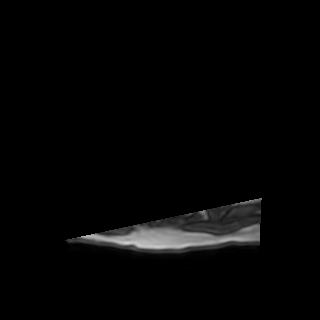
[im 25/50]
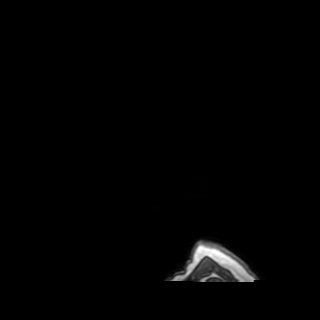
[im 50/50]
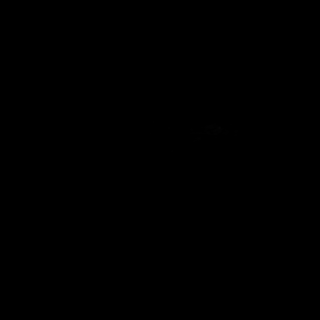

[Series 601: SWI · axial · 3.0mm · 0.40mm/px · z∈[-122,+24]mm · 9 of 200 slices shown]
[im 1/200]
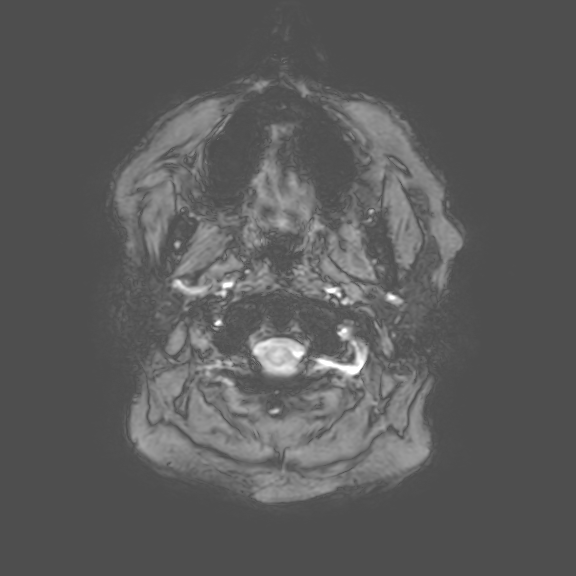
[im 29/200]
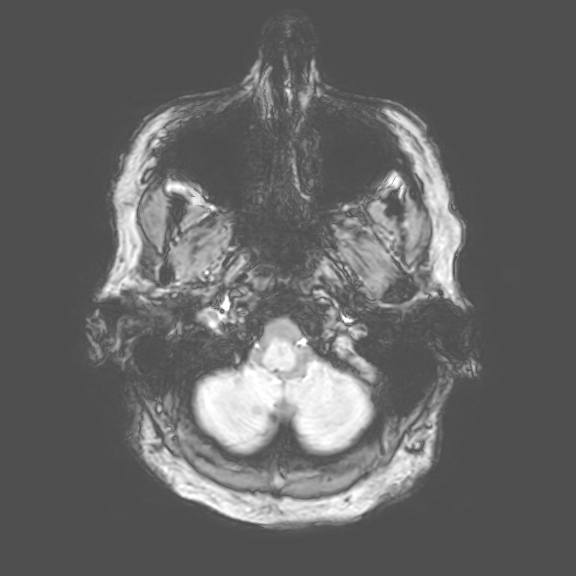
[im 57/200]
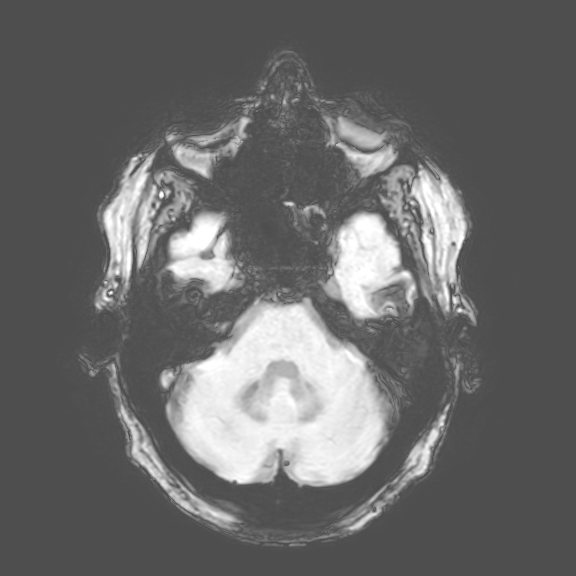
[im 86/200]
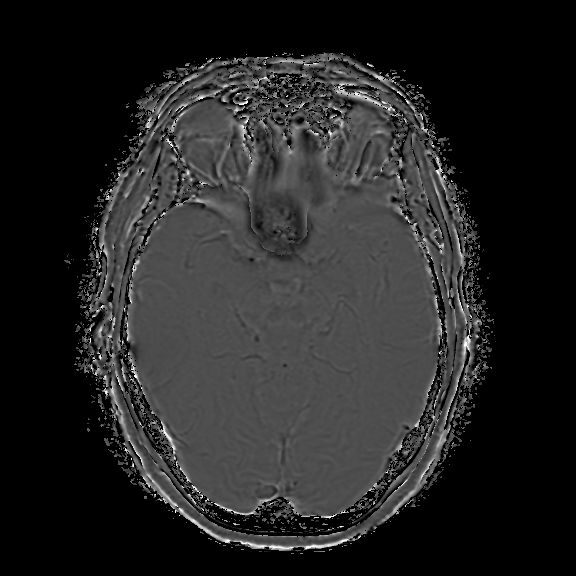
[im 100/200]
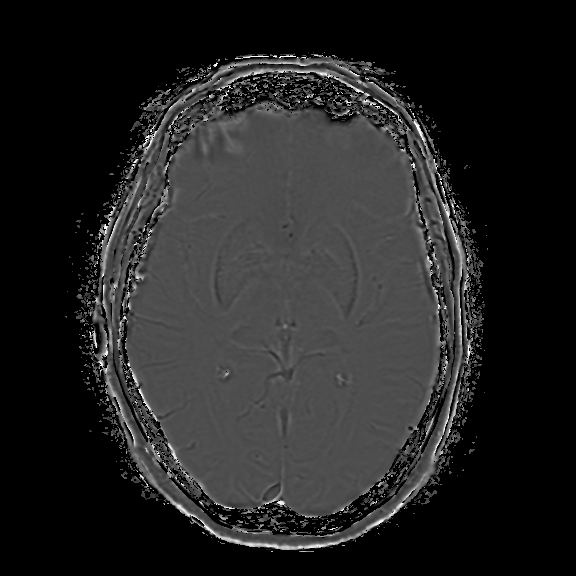
[im 114/200]
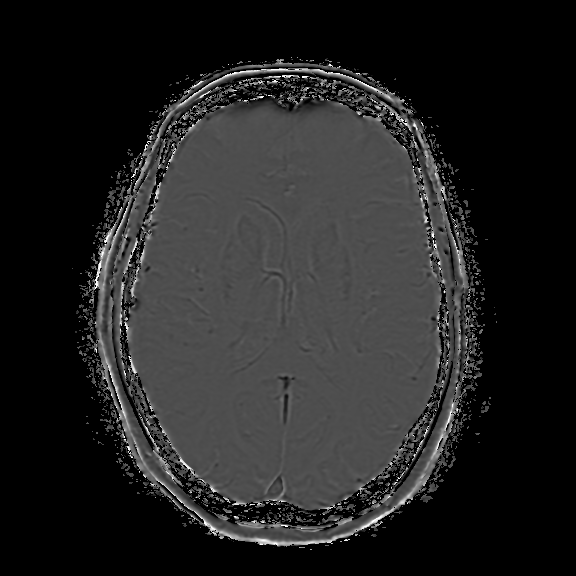
[im 143/200]
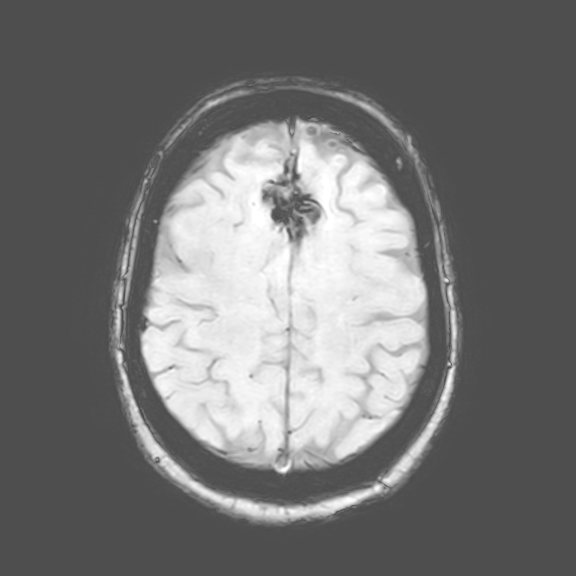
[im 171/200]
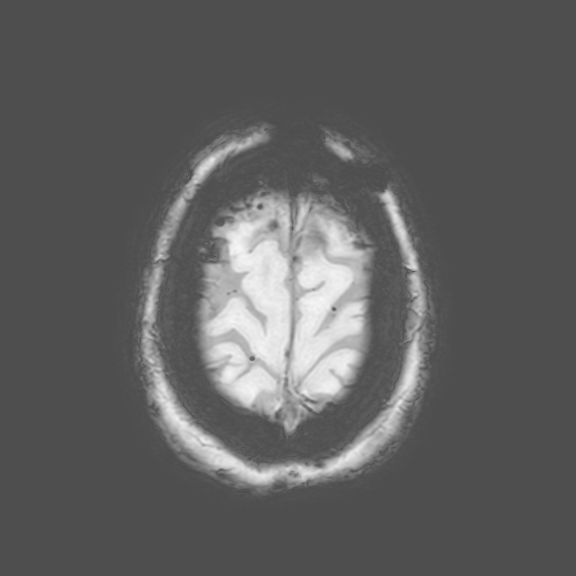
[im 200/200]
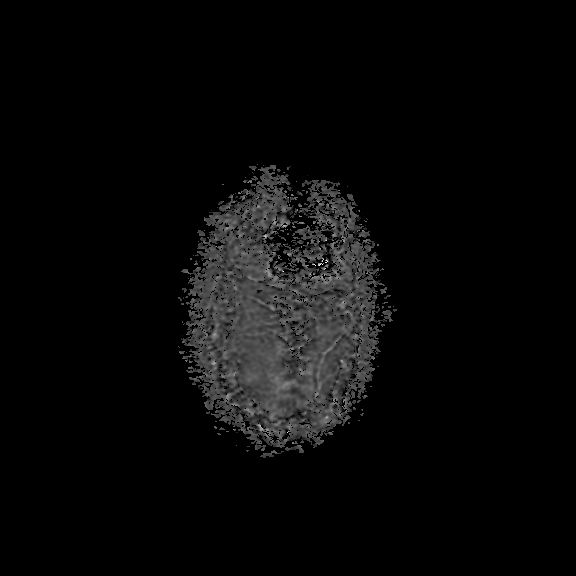

[Series 701: T2 fat-sat · axial · 5.0mm · 0.43mm/px · z∈[-125,+29]mm · 2 of 27 slices shown]
[im 1/27]
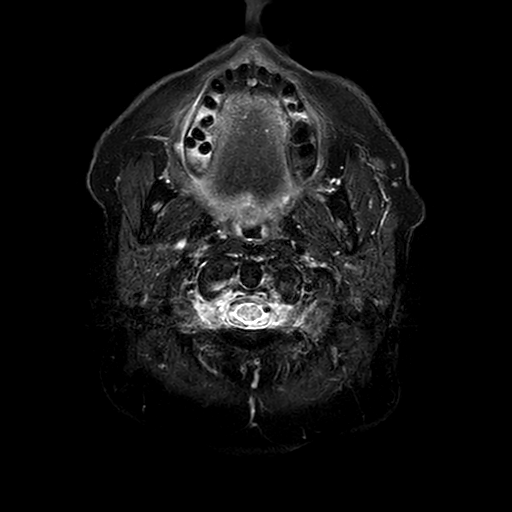
[im 27/27]
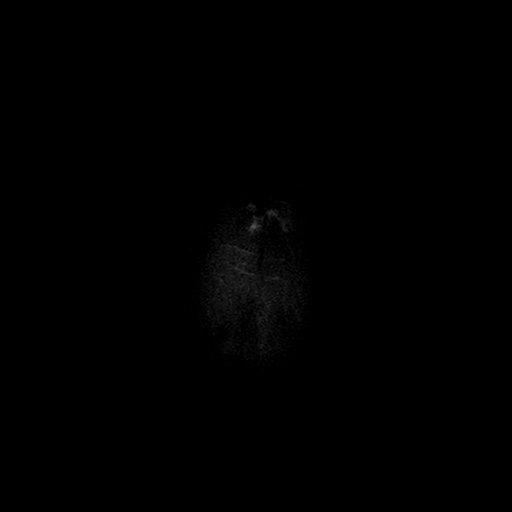

[Series 801: T1 post-contrast · axial · 5.0mm · 0.43mm/px · z∈[-125,+29]mm · 2 of 27 slices shown (1 of 2)]
[im 1/27]
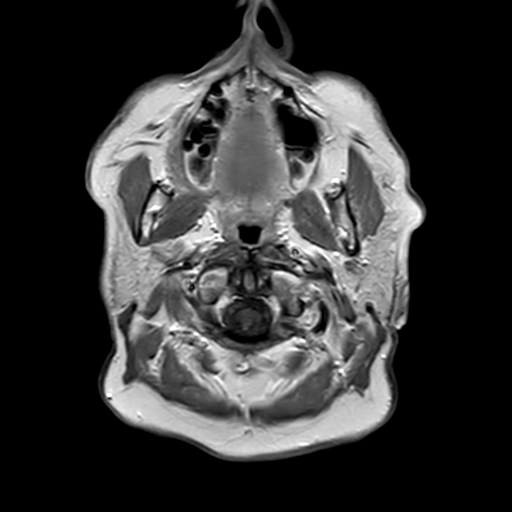
[im 27/27]
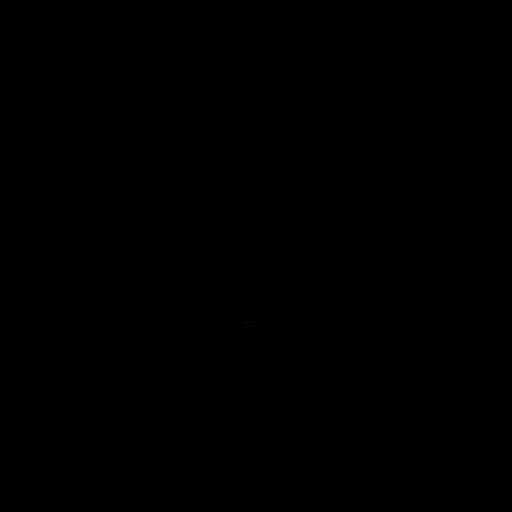

[Series 901: T1 post-contrast · coronal · 4.0mm · 0.41mm/px · 3 of 36 slices shown (2 of 2)]
[im 1/36]
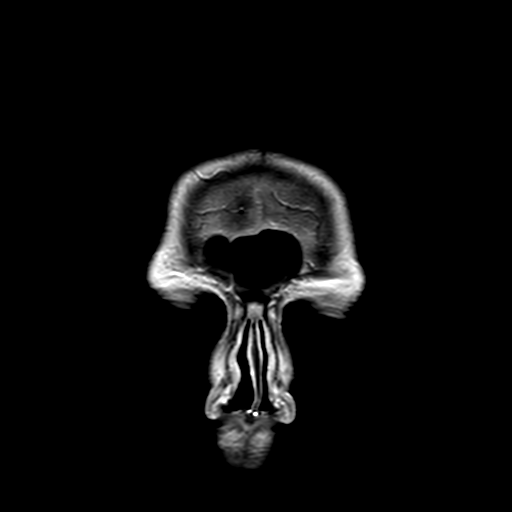
[im 18/36]
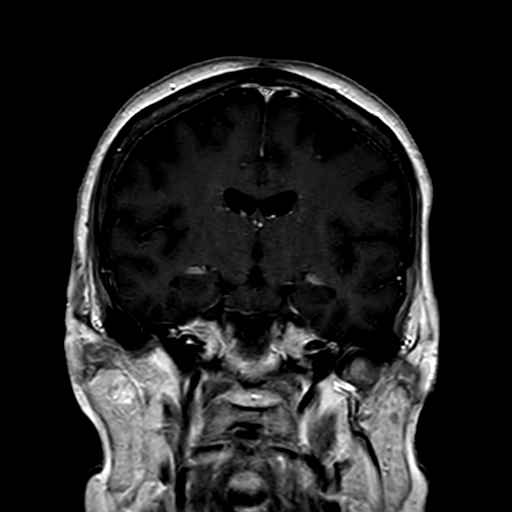
[im 36/36]
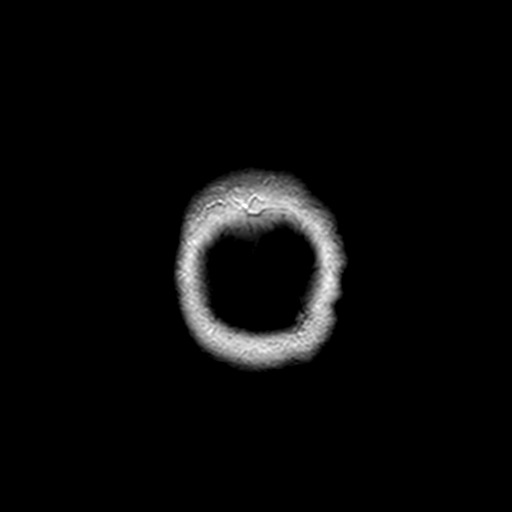

[20 of 48 positions shown; findings below may reference images not displayed]

FINDINGS: Patient is status post anterior vertex craniotomy. Stable gliosis. No 
evidence for recurrent or new neoplasm. Mild meningeal fibrosis subjacent to the 
craniotomy flap is stable. 
No hydrocephalus. Diffusion images are negative. Posterior fossa contents are 
intact. Major intracranial arterial segments are open. Craniocervical junction 
is open. Pituitary gland height 7 mm, unchanged from the prior study.
IMPRESSION: Stable postoperative changes. No evidence for recurrent or new neoplasm.

## 2022-03-15 NOTE — Progress Notes (Signed)
Pt new to area - to establish care here. Will need MRI brain w/wout next April 2024 f/u for yearly.   Order will need placed -meningioma resection in past.

## 2023-01-25 ENCOUNTER — Ambulatory Visit: Admit: 2023-01-25 | Discharge: 2023-02-05 | Payer: MEDICARE

## 2023-01-25 DIAGNOSIS — Z9889 Other specified postprocedural states: Secondary | ICD-10-CM

## 2023-01-25 NOTE — Assessment & Plan Note (Signed)
Status post resection of frontal fossa meningioma 2020.  She is doing quite well.  The fact that her last MRI looks so good and she is completely asymptomatic we will hold off and get her next MRI in a year.  We had a discussion that if it any point she became concerned that anything was different let us know we will get an MRI sooner.  Will see her back after her MRI in a year

## 2023-01-25 NOTE — Progress Notes (Signed)
[facility] OFFICE NOTE:         CC: History of meningioma and establishing care    History of Present Illness:  Melinda Carroll (29-Aug-1950) is a 72 y.o.  female who was referred by Ephriam Knuckles, MD for evaluation of meningioma and establish care.  In 2020 was having some memory issues because of a family history of Alzheimer's and dementia was very sensitive to those changes and an MRI was obtained which revealed what sounds like anterior fossa falcine meningioma.  She was taken to the operating room in [place] and December 2020 for a craniotomy for meningioma.  They relocated to the [place] and was told she should have yearly follow-ups.  She states that most of her "senior moments" resolved after her surgery and she is having absolutely no problems    HPI      Past Medical History:   Diagnosis Date    Meningioma (HCC)     Neuropathy       Past Surgical History:   Procedure Laterality Date    BREAST LUMPECTOMY      02.2019    CRANIOTOMY      12.2020    LUMBAR FUSION      2003      Current Outpatient Medications   Medication Sig Dispense Refill    anastrozole (ARIMIDEX) 1 MG tablet Take 1 tablet by mouth daily      empagliflozin (JARDIANCE) 25 MG tablet Take 1 tablet by mouth daily      ezetimibe (ZETIA) 10 MG tablet Take 1 tablet by mouth daily      pantoprazole (PROTONIX) 40 MG tablet Take 1 tablet by mouth daily      Semaglutide,0.25 or 0.5MG /DOS, (OZEMPIC, 0.25 OR 0.5 MG/DOSE,) 2 MG/1.5ML SOPN Inject into the skin      Denosumab (PROLIA SC) Inject into the skin Every 6 months      Cholecalciferol (VITAMIN D) 10 MCG (400 UNIT) CAPS Capsule Take 1 capsule by mouth daily      Magnesium Glycinate 100 MG CAPS Take by mouth      Omega-3 Fatty Acids (FISH OIL) 300 MG CAPS Take 980 mg by mouth       No current facility-administered medications for this visit.      No Known Allergies   Family History   Problem Relation Age of Onset    Memory Loss Mother     Alzheimer's Disease Sister        Social History     Socioeconomic History    Marital status: Married     Spouse name: Not on file    Number of children: Not on file    Years of education: Not on file    Highest education level: Not on file   Occupational History    Not on file   Tobacco Use    Smoking status: Former     Current packs/day: 0.00     Average packs/day: 0.5 packs/day for 15.0 years (7.5 ttl pk-yrs)     Types: Cigarettes     Start date: 11/08/1963     Quit date: 08/14/1978     Years since quitting: 44.4    Smokeless tobacco: Never   Substance and Sexual Activity    Alcohol use: Yes     Alcohol/week: 2.0 standard drinks of alcohol     Types: 1 Glasses of wine, 1 Drinks containing 0.5 oz of alcohol per week    Drug use: Never  Sexual activity: Not Currently     Partners: Male   Other Topics Concern    Not on file   Social History Narrative    Not on file     Social Determinants of Health     Financial Resource Strain: Not on file   Food Insecurity: Not on file   Transportation Needs: Not on file   Physical Activity: Not on file   Stress: Not on file   Social Connections: Not on file   Intimate Partner Violence: Not on file   Housing Stability: Not on file        Patient Active Problem List   Diagnosis    History of resection of meningioma      Review of Systems   Constitutional: Negative.    HENT: Negative.     Eyes: Negative.    Respiratory: Negative.     Cardiovascular: Negative.    Gastrointestinal: Negative.    Endocrine: Negative.    Genitourinary: Negative.    Musculoskeletal: Negative.    Allergic/Immunologic: Negative.    Neurological: Negative.    Hematological: Negative.    Psychiatric/Behavioral: Negative.              Neurologic Exam     Mental Status   Oriented to person, place, and time.   Speech: speech is normal     Cranial Nerves     CN III, IV, VI   Pupils are equal, round, and reactive to light.       Physical Exam  Constitutional:       General: She is not in acute distress.     Appearance: Normal appearance. She is  not ill-appearing.   HENT:      Head: Normocephalic.      Comments: She has a very well-healed T-shaped incision at about her coronal suture     Nose: Nose normal. No congestion or rhinorrhea.      Mouth/Throat:      Mouth: Mucous membranes are moist.      Pharynx: Oropharynx is clear. No oropharyngeal exudate or posterior oropharyngeal erythema.   Eyes:      Pupils: Pupils are equal, round, and reactive to light.   Pulmonary:      Effort: Pulmonary effort is normal. No respiratory distress.      Breath sounds: No stridor.   Skin:     General: Skin is warm and dry.      Coloration: Skin is not jaundiced.      Findings: No rash.   Neurological:      General: No focal deficit present.      Mental Status: She is alert and oriented to person, place, and time. Mental status is at baseline.      Cranial Nerves: No cranial nerve deficit.      Motor: No weakness.      Coordination: Coordination normal.      Gait: Gait normal.   Psychiatric:         Attention and Perception: Attention and perception normal.         Mood and Affect: Mood normal.         Speech: Speech normal.         Behavior: Behavior normal.         Thought Content: Thought content normal.         Cognition and Memory: Cognition normal.         Judgment: Judgment normal.  Imaging: I personally reviewed:  The MRI of the  brain dated October 24, 2021 from  [facility] in [place] and compared with no comparison images. My review demonstrates postoperative changes in the the frontal area.  There is a little thickening around the anterior sagittal sinus and falx but no significant true meningioma is visualized.  Overall this is like an excellent surgical result.      Problem List Items Addressed This Visit          Other    History of resection of meningioma - Primary     Status post resection of frontal fossa meningioma 2020.  She is doing quite well.  The fact that her last MRI looks so good and she is completely asymptomatic we will hold off  and get her next MRI in a year.  We had a discussion that if it any point she became concerned that anything was different let us know we will get an MRI sooner.  Will see her back after her MRI in a year              1. History of resection of meningioma  Assessment & Plan:  Status post resection of frontal fossa meningioma 2020.  She is doing quite well.  The fact that her last MRI looks so good and she is completely asymptomatic we will hold off and get her next MRI in a year.  We had a discussion that if it any point she became concerned that anything was different let us know we will get an MRI sooner.  Will see her back after her MRI in a year        On this date, 01/25/2023,I have spent 60 minutes reviewing previous notes, test results and face to face (or virtual) with the patient discussing the diagnosis and treatment plan as well as documenting on the day of the visit. All questions were answered. An understanding and agreement was voiced.    This note was created using voice recognition software.  All attempts were made to recognize and correct voice recognition errors. Unfortunately some errors may persist.      E. Arnette Norris, MD Janetta Hora

## 2023-01-26 NOTE — Addendum Note (Signed)
Addended by: Gerri Spore on: 01/26/2023 01:37 PM     Modules accepted: Orders

## 2023-12-14 LAB — HEMOGLOBIN A1C
Estimated Avg Glucose, External: 151 mg/dL
Hemoglobin A1C, External: 6.9 % — ABNORMAL HIGH (ref 4.8–5.6)

## 2024-02-22 ENCOUNTER — Ambulatory Visit: Admit: 2024-02-22 | Discharge: 2024-05-22 | Payer: MEDICARE

## 2024-02-22 DIAGNOSIS — Z8603 Personal history of neoplasm of uncertain behavior: Principal | ICD-10-CM

## 2024-02-22 MED ORDER — GADOTERIDOL 279.3 MG/ML IV SOLN
279.3 | Freq: Once | INTRAVENOUS | Status: AC | PRN
Start: 2024-02-22 — End: 2024-02-22
  Administered 2024-02-22: 15:00:00 15 mL via INTRAVENOUS
